# Patient Record
Sex: Female | Born: 1985
Health system: Southern US, Community
[De-identification: ages and names within clinical notes are randomized; demographics above are authoritative.]

## PROBLEM LIST (undated history)

## (undated) DIAGNOSIS — Z8742 Personal history of other diseases of the female genital tract: Secondary | ICD-10-CM

## (undated) DIAGNOSIS — E039 Hypothyroidism, unspecified: Secondary | ICD-10-CM

## (undated) HISTORY — PX: WISDOM TOOTH EXTRACTION: SHX21

## (undated) HISTORY — PX: SALPINGECTOMY: SHX328

## (undated) HISTORY — DX: Personal history of other diseases of the female genital tract: Z87.42

---

## 2006-07-24 ENCOUNTER — Other Ambulatory Visit: Admission: RE | Admit: 2006-07-24 | Discharge: 2006-07-24 | Payer: Self-pay | Admitting: Obstetrics and Gynecology

## 2006-09-07 ENCOUNTER — Emergency Department (HOSPITAL_COMMUNITY): Admission: EM | Admit: 2006-09-07 | Discharge: 2006-09-07 | Payer: Self-pay | Admitting: Emergency Medicine

## 2007-04-03 ENCOUNTER — Emergency Department (HOSPITAL_COMMUNITY): Admission: EM | Admit: 2007-04-03 | Discharge: 2007-04-03 | Payer: Self-pay | Admitting: Emergency Medicine

## 2007-05-25 ENCOUNTER — Emergency Department (HOSPITAL_COMMUNITY): Admission: EM | Admit: 2007-05-25 | Discharge: 2007-05-26 | Payer: Self-pay | Admitting: Emergency Medicine

## 2007-09-22 ENCOUNTER — Emergency Department (HOSPITAL_COMMUNITY): Admission: EM | Admit: 2007-09-22 | Discharge: 2007-09-22 | Payer: Self-pay | Admitting: Emergency Medicine

## 2007-12-19 ENCOUNTER — Emergency Department (HOSPITAL_COMMUNITY): Admission: EM | Admit: 2007-12-19 | Discharge: 2007-12-19 | Payer: Self-pay | Admitting: Emergency Medicine

## 2008-01-08 ENCOUNTER — Emergency Department (HOSPITAL_COMMUNITY): Admission: EM | Admit: 2008-01-08 | Discharge: 2008-01-09 | Payer: Self-pay | Admitting: Emergency Medicine

## 2009-02-21 ENCOUNTER — Emergency Department (HOSPITAL_COMMUNITY): Admission: EM | Admit: 2009-02-21 | Discharge: 2009-02-21 | Payer: Self-pay | Admitting: Emergency Medicine

## 2009-06-12 ENCOUNTER — Emergency Department (HOSPITAL_COMMUNITY): Admission: EM | Admit: 2009-06-12 | Discharge: 2009-06-12 | Payer: Self-pay | Admitting: Emergency Medicine

## 2009-06-16 ENCOUNTER — Emergency Department (HOSPITAL_COMMUNITY): Admission: EM | Admit: 2009-06-16 | Discharge: 2009-06-16 | Payer: Self-pay | Admitting: Emergency Medicine

## 2009-09-30 ENCOUNTER — Emergency Department (HOSPITAL_COMMUNITY): Admission: EM | Admit: 2009-09-30 | Discharge: 2009-09-30 | Payer: Self-pay | Admitting: Emergency Medicine

## 2009-10-01 ENCOUNTER — Emergency Department (HOSPITAL_COMMUNITY): Admission: EM | Admit: 2009-10-01 | Discharge: 2009-10-01 | Payer: Self-pay | Admitting: Family Medicine

## 2010-04-21 ENCOUNTER — Emergency Department (HOSPITAL_COMMUNITY)
Admission: EM | Admit: 2010-04-21 | Discharge: 2010-04-21 | Payer: Self-pay | Source: Home / Self Care | Admitting: Emergency Medicine

## 2010-05-14 ENCOUNTER — Encounter: Payer: Self-pay | Admitting: Obstetrics and Gynecology

## 2010-07-03 LAB — URINALYSIS, ROUTINE W REFLEX MICROSCOPIC
Glucose, UA: NEGATIVE mg/dL
Hgb urine dipstick: NEGATIVE
Protein, ur: NEGATIVE mg/dL
Urobilinogen, UA: 1 mg/dL (ref 0.0–1.0)

## 2010-07-03 LAB — WET PREP, GENITAL: Trich, Wet Prep: NONE SEEN

## 2010-07-03 LAB — POCT I-STAT, CHEM 8
BUN: 15 mg/dL (ref 6–23)
Chloride: 108 mEq/L (ref 96–112)
HCT: 42 % (ref 36.0–46.0)
Hemoglobin: 14.3 g/dL (ref 12.0–15.0)
Potassium: 3.7 mEq/L (ref 3.5–5.1)

## 2010-07-03 LAB — URINE MICROSCOPIC-ADD ON

## 2010-07-10 LAB — URINE MICROSCOPIC-ADD ON

## 2010-07-10 LAB — URINALYSIS, ROUTINE W REFLEX MICROSCOPIC
Glucose, UA: NEGATIVE mg/dL
Ketones, ur: NEGATIVE mg/dL
Specific Gravity, Urine: 1.023 (ref 1.005–1.030)

## 2010-07-10 LAB — GC/CHLAMYDIA PROBE AMP, GENITAL: Chlamydia, DNA Probe: NEGATIVE

## 2010-07-10 LAB — WET PREP, GENITAL
WBC, Wet Prep HPF POC: NONE SEEN
Yeast Wet Prep HPF POC: NONE SEEN

## 2010-07-12 LAB — CBC
HCT: 37.8 % (ref 36.0–46.0)
HCT: 42.6 % (ref 36.0–46.0)
Hemoglobin: 12.7 g/dL (ref 12.0–15.0)
MCHC: 34.1 g/dL (ref 30.0–36.0)
MCV: 88.4 fL (ref 78.0–100.0)
Platelets: 151 10*3/uL (ref 150–400)
RBC: 4.24 MIL/uL (ref 3.87–5.11)
RDW: 13.7 % (ref 11.5–15.5)
RDW: 13.8 % (ref 11.5–15.5)

## 2010-07-12 LAB — URINALYSIS, ROUTINE W REFLEX MICROSCOPIC
Bilirubin Urine: NEGATIVE
Glucose, UA: NEGATIVE mg/dL
Leukocytes, UA: NEGATIVE
Nitrite: NEGATIVE
Protein, ur: NEGATIVE mg/dL
Specific Gravity, Urine: 1.027 (ref 1.005–1.030)
Specific Gravity, Urine: 1.029 (ref 1.005–1.030)
Urobilinogen, UA: 0.2 mg/dL (ref 0.0–1.0)
pH: 6 (ref 5.0–8.0)
pH: 6.5 (ref 5.0–8.0)

## 2010-07-12 LAB — COMPREHENSIVE METABOLIC PANEL
ALT: 24 U/L (ref 0–35)
AST: 22 U/L (ref 0–37)
Albumin: 3.9 g/dL (ref 3.5–5.2)
Alkaline Phosphatase: 56 U/L (ref 39–117)
GFR calc Af Amer: >60 mL/min (ref >60)
GFR calc non Af Amer: >60 mL/min (ref >60)
Glucose, Bld: 99 mg/dL (ref 70–99)
Potassium: 4.4 mEq/L (ref 3.5–5.1)
Sodium: 137 mEq/L (ref 135–145)
Total Bilirubin: 0.4 mg/dL (ref 0.3–1.2)

## 2010-07-12 LAB — BASIC METABOLIC PANEL
Calcium: 8 mg/dL — ABNORMAL LOW (ref 8.4–10.5)
GFR calc Af Amer: >60 mL/min (ref >60)
GFR calc non Af Amer: >60 mL/min (ref >60)
Glucose, Bld: 92 mg/dL (ref 70–99)
Potassium: 3.8 mEq/L (ref 3.5–5.1)
Sodium: 138 mEq/L (ref 135–145)

## 2010-07-12 LAB — PROTIME-INR: INR: 1.28 (ref 0.00–1.49)

## 2010-07-12 LAB — APTT: aPTT: 35 seconds (ref 24–37)

## 2010-07-12 LAB — DIFFERENTIAL
Basophils Absolute: 0 10*3/uL (ref 0.0–0.1)
Basophils Absolute: 0 10*3/uL (ref 0.0–0.1)
Eosinophils Absolute: 0.1 10*3/uL (ref 0.0–0.7)
Eosinophils Relative: 4 % (ref 0–5)
Lymphocytes Relative: 57 % — ABNORMAL HIGH (ref 12–46)
Lymphs Abs: 2.1 10*3/uL (ref 0.7–4.0)
Monocytes Absolute: 0.2 10*3/uL (ref 0.1–1.0)
Monocytes Relative: 6 % (ref 3–12)
Neutro Abs: 1.2 10*3/uL — ABNORMAL LOW (ref 1.7–7.7)
Neutrophils Relative %: 43 % (ref 43–77)

## 2010-07-12 LAB — URINE MICROSCOPIC-ADD ON

## 2010-07-12 LAB — GC/CHLAMYDIA PROBE AMP, GENITAL
Chlamydia, DNA Probe: NEGATIVE
GC Probe Amp, Genital: NEGATIVE

## 2010-07-12 LAB — WET PREP, GENITAL: Yeast Wet Prep HPF POC: NONE SEEN

## 2010-07-26 LAB — CBC
HCT: 40.9 % (ref 36.0–46.0)
MCV: 89.6 fL (ref 78.0–100.0)
Platelets: 160 10*3/uL (ref 150–400)
RBC: 4.57 MIL/uL (ref 3.87–5.11)
WBC: 6 10*3/uL (ref 4.0–10.5)

## 2010-07-26 LAB — DIFFERENTIAL
Basophils Absolute: 0 10*3/uL (ref 0.0–0.1)
Basophils Relative: 1 % (ref 0–1)
Eosinophils Relative: 2 % (ref 0–5)
Monocytes Absolute: 0.3 10*3/uL (ref 0.1–1.0)

## 2010-07-26 LAB — LIPASE, BLOOD: Lipase: 20 U/L (ref 11–59)

## 2010-07-26 LAB — URINE MICROSCOPIC-ADD ON

## 2010-07-26 LAB — WET PREP, GENITAL
WBC, Wet Prep HPF POC: NONE SEEN
Yeast Wet Prep HPF POC: NONE SEEN

## 2010-07-26 LAB — COMPREHENSIVE METABOLIC PANEL
AST: 14 U/L (ref 0–37)
Albumin: 3.9 g/dL (ref 3.5–5.2)
Alkaline Phosphatase: 50 U/L (ref 39–117)
BUN: 8 mg/dL (ref 6–23)
CO2: 26 mEq/L (ref 19–32)
Chloride: 106 mEq/L (ref 96–112)
Creatinine, Ser: 0.72 mg/dL (ref 0.4–1.2)
GFR calc Af Amer: >60 mL/min (ref >60)
GFR calc non Af Amer: >60 mL/min (ref >60)
Potassium: 3.5 mEq/L (ref 3.5–5.1)
Total Bilirubin: 0.6 mg/dL (ref 0.3–1.2)

## 2010-07-26 LAB — URINALYSIS, ROUTINE W REFLEX MICROSCOPIC
Nitrite: NEGATIVE
Specific Gravity, Urine: 1.025 (ref 1.005–1.030)
Urobilinogen, UA: 1 mg/dL (ref 0.0–1.0)
pH: 6.5 (ref 5.0–8.0)

## 2010-07-29 ENCOUNTER — Emergency Department (HOSPITAL_COMMUNITY)
Admission: EM | Admit: 2010-07-29 | Discharge: 2010-07-29 | Disposition: A | Discharge status: Home / Self Care | Payer: Self-pay | Attending: Emergency Medicine | Admitting: Emergency Medicine

## 2010-07-29 DIAGNOSIS — R109 Unspecified abdominal pain: Secondary | ICD-10-CM | POA: Insufficient documentation

## 2010-07-29 DIAGNOSIS — A499 Bacterial infection, unspecified: Secondary | ICD-10-CM | POA: Insufficient documentation

## 2010-07-29 DIAGNOSIS — K649 Unspecified hemorrhoids: Secondary | ICD-10-CM | POA: Insufficient documentation

## 2010-07-29 DIAGNOSIS — F17200 Nicotine dependence, unspecified, uncomplicated: Secondary | ICD-10-CM | POA: Insufficient documentation

## 2010-07-29 DIAGNOSIS — N760 Acute vaginitis: Secondary | ICD-10-CM | POA: Insufficient documentation

## 2010-07-29 DIAGNOSIS — R10819 Abdominal tenderness, unspecified site: Secondary | ICD-10-CM | POA: Insufficient documentation

## 2010-07-29 DIAGNOSIS — B9689 Other specified bacterial agents as the cause of diseases classified elsewhere: Secondary | ICD-10-CM | POA: Insufficient documentation

## 2010-07-29 LAB — DIFFERENTIAL
Eosinophils Absolute: 0.2 10*3/uL (ref 0.0–0.7)
Eosinophils Relative: 4 % (ref 0–5)
Lymphocytes Relative: 37 % (ref 12–46)
Lymphs Abs: 1.8 10*3/uL (ref 0.7–4.0)
Monocytes Absolute: 0.4 10*3/uL (ref 0.1–1.0)
Monocytes Relative: 8 % (ref 3–12)

## 2010-07-29 LAB — URINALYSIS, ROUTINE W REFLEX MICROSCOPIC
Bilirubin Urine: NEGATIVE
Ketones, ur: NEGATIVE mg/dL
Nitrite: NEGATIVE
pH: 7.5 (ref 5.0–8.0)

## 2010-07-29 LAB — WET PREP, GENITAL
Trich, Wet Prep: NONE SEEN
Yeast Wet Prep HPF POC: NONE SEEN

## 2010-07-29 LAB — CBC
HCT: 37.9 % (ref 36.0–46.0)
MCH: 29.6 pg (ref 26.0–34.0)
MCHC: 33.8 g/dL (ref 30.0–36.0)
MCV: 87.5 fL (ref 78.0–100.0)
RDW: 13.3 % (ref 11.5–15.5)

## 2010-07-29 LAB — COMPREHENSIVE METABOLIC PANEL
Alkaline Phosphatase: 41 U/L (ref 39–117)
BUN: 10 mg/dL (ref 6–23)
Calcium: 8.6 mg/dL (ref 8.4–10.5)
Creatinine, Ser: 0.67 mg/dL (ref 0.4–1.2)
Glucose, Bld: 85 mg/dL (ref 70–99)
Potassium: 3.9 mEq/L (ref 3.5–5.1)
Total Protein: 6.4 g/dL (ref 6.0–8.3)

## 2010-07-29 LAB — OCCULT BLOOD, POC DEVICE: Fecal Occult Bld: NEGATIVE

## 2010-11-22 ENCOUNTER — Ambulatory Visit: Payer: Self-pay | Admitting: Family Medicine

## 2010-11-22 DIAGNOSIS — Z0289 Encounter for other administrative examinations: Secondary | ICD-10-CM

## 2011-01-12 LAB — URINALYSIS, ROUTINE W REFLEX MICROSCOPIC
Glucose, UA: NEGATIVE
Hgb urine dipstick: NEGATIVE
Ketones, ur: 15 — AB
pH: 6

## 2011-01-12 LAB — CBC
HCT: 39.7
Hemoglobin: 13.3
RBC: 4.62
WBC: 5.7

## 2011-01-12 LAB — POCT I-STAT CREATININE
Creatinine, Ser: 1
Operator id: 151321

## 2011-01-12 LAB — I-STAT 8, (EC8 V) (CONVERTED LAB)
Acid-base deficit: 1
Potassium: 3.5
TCO2: 27
pCO2, Ven: 49.3
pH, Ven: 7.327 — ABNORMAL HIGH

## 2011-01-12 LAB — DIFFERENTIAL
Eosinophils Relative: 2
Lymphocytes Relative: 35
Lymphs Abs: 2
Monocytes Absolute: 0.4
Monocytes Relative: 6

## 2011-01-12 LAB — URINE MICROSCOPIC-ADD ON

## 2011-01-12 LAB — GC/CHLAMYDIA PROBE AMP, GENITAL: GC Probe Amp, Genital: NEGATIVE

## 2011-01-12 LAB — WET PREP, GENITAL: Trich, Wet Prep: NONE SEEN

## 2011-01-18 LAB — URINE MICROSCOPIC-ADD ON

## 2011-01-18 LAB — URINALYSIS, ROUTINE W REFLEX MICROSCOPIC
Bilirubin Urine: NEGATIVE
Glucose, UA: NEGATIVE
Specific Gravity, Urine: 1.019
pH: 6.5

## 2011-01-22 LAB — URINE MICROSCOPIC-ADD ON

## 2011-01-22 LAB — URINALYSIS, ROUTINE W REFLEX MICROSCOPIC
Bilirubin Urine: NEGATIVE
Glucose, UA: NEGATIVE
Hgb urine dipstick: NEGATIVE
Ketones, ur: NEGATIVE
Nitrite: POSITIVE — AB
Protein, ur: NEGATIVE
Specific Gravity, Urine: 1.03
Urobilinogen, UA: 1
pH: 6

## 2011-01-22 LAB — RPR: RPR Ser Ql: NONREACTIVE

## 2011-01-22 LAB — GC/CHLAMYDIA PROBE AMP, GENITAL: Chlamydia, DNA Probe: NEGATIVE

## 2011-01-22 LAB — WET PREP, GENITAL
Trich, Wet Prep: NONE SEEN
Yeast Wet Prep HPF POC: NONE SEEN

## 2011-01-22 LAB — HEMOGLOBIN AND HEMATOCRIT, BLOOD: HCT: 37.9

## 2011-01-22 LAB — POCT PREGNANCY, URINE: Preg Test, Ur: NEGATIVE

## 2011-01-23 ENCOUNTER — Emergency Department (HOSPITAL_COMMUNITY)
Admission: EM | Admit: 2011-01-23 | Discharge: 2011-01-23 | Disposition: A | Discharge status: Home / Self Care | Payer: BC Managed Care – PPO | Attending: Emergency Medicine | Admitting: Emergency Medicine

## 2011-01-23 DIAGNOSIS — K645 Perianal venous thrombosis: Secondary | ICD-10-CM | POA: Insufficient documentation

## 2011-04-25 ENCOUNTER — Emergency Department (HOSPITAL_COMMUNITY)
Admission: EM | Admit: 2011-04-25 | Discharge: 2011-04-25 | Disposition: A | Discharge status: Home / Self Care | Payer: BC Managed Care – PPO | Attending: Emergency Medicine | Admitting: Emergency Medicine

## 2011-04-25 ENCOUNTER — Encounter: Payer: Self-pay | Admitting: *Deleted

## 2011-04-25 DIAGNOSIS — R51 Headache: Secondary | ICD-10-CM | POA: Insufficient documentation

## 2011-04-25 DIAGNOSIS — M273 Alveolitis of jaws: Secondary | ICD-10-CM | POA: Insufficient documentation

## 2011-04-25 DIAGNOSIS — K089 Disorder of teeth and supporting structures, unspecified: Secondary | ICD-10-CM | POA: Insufficient documentation

## 2011-04-25 MED ORDER — OXYCODONE-ACETAMINOPHEN 5-325 MG PO TABS
1.0000 | ORAL_TABLET | Freq: Once | ORAL | Status: AC
  Administered 2011-04-25: 1 via ORAL
  Filled 2011-04-25: qty 1

## 2011-04-25 MED ORDER — OXYCODONE-ACETAMINOPHEN 5-325 MG PO TABS: 1.0000 | ORAL_TABLET | ORAL | Status: AC | PRN

## 2011-04-25 MED ORDER — PENICILLIN V POTASSIUM 500 MG PO TABS: 500.0000 mg | ORAL_TABLET | Freq: Four times a day (QID) | ORAL | Status: AC

## 2011-04-25 NOTE — ED Notes (Signed)
Pt reports she had root canal on Saturday, reports throbbing and headache to right side. Pt reports she has had headaches for unknown time but it worsened after the root canal.

## 2011-04-25 NOTE — ED Provider Notes (Signed)
History     CSN: [id number]  Arrival date & time 04/25/11  1742   First MD Initiated Contact with Patient 04/25/11 1906      Chief Complaint  Patient presents with  . Facial Pain    (Consider location/radiation/quality/duration/timing/severity/associated sxs/prior treatment) HPI Comments: Patient presents with throbbing right-sided pain and headache.  She states that she had a dental extraction performed on Saturday.  She was feeling fine however her pain has started today and is gradually worsening.  Patient cannot recall the name of the dentist.  She denies fevers, night sweats, chills.  Patient states that she is a current smoker and that she did not wait to 48 hours recommended by her dentist before smoking again.  Patient states that her pain is worsened by heat and cold.  The history is provided by the patient.    History reviewed. No pertinent past medical history.  History reviewed. No pertinent past surgical history.  History reviewed. No pertinent family history.  History  Substance Use Topics  . Smoking status: Never Smoker   . Smokeless tobacco: Not on file  . Alcohol Use: No    OB History    Grav Para Term Preterm Abortions TAB SAB Ect Mult Living                  Review of Systems  Constitutional: Negative for fever, chills, diaphoresis and activity change.  HENT: Positive for dental problem (right sd facial pain ). Negative for ear pain, sore throat, facial swelling, drooling, mouth sores, trouble swallowing, neck pain, neck stiffness, voice change, sinus pressure and tinnitus.   Eyes: Negative for pain and visual disturbance.  Respiratory: Negative for shortness of breath, wheezing and stridor.   Cardiovascular: Negative for chest pain.  Gastrointestinal: Negative for nausea and abdominal pain.  Musculoskeletal: Negative for myalgias.  Skin: Negative for rash.  Neurological: Positive for headaches. Negative for speech difficulty.  Hematological:  Negative for adenopathy.  All other systems reviewed and are negative.    Allergies  Ciprofloxacin  Home Medications  No current outpatient prescriptions on file.  BP 115/71  Pulse 68  Temp(Src) 98.2 F (36.8 C) (Oral)  Resp 19  SpO2 99%  Physical Exam  Nursing note and vitals reviewed. Constitutional: She is oriented to person, place, and time. She appears well-developed and well-nourished. No distress.  HENT:  Head: Normocephalic and atraumatic. No trismus in the jaw.  Mouth/Throat: Uvula is midline, oropharynx is clear and moist and mucous membranes are normal. No oral lesions. Abnormal dentition. No dental abscesses or uvula swelling. No oropharyngeal exudate, posterior oropharyngeal edema, posterior oropharyngeal erythema or tonsillar abscesses.       No evidence of clot where extraction occurred. Likely dx "Dry Socket"   Eyes: Conjunctivae and EOM are normal.  Neck: Normal range of motion and full passive range of motion without pain. Neck supple.  Cardiovascular: Normal rate and regular rhythm.   Pulmonary/Chest: Effort normal and breath sounds normal. No stridor.  Musculoskeletal: Normal range of motion.  Lymphadenopathy:       Head (right side): No submental, no submandibular, no tonsillar, no preauricular and no posterior auricular adenopathy present.       Head (left side): No submental, no submandibular, no tonsillar, no preauricular and no posterior auricular adenopathy present.    She has no cervical adenopathy.  Neurological: She is alert and oriented to person, place, and time.  Skin: Skin is warm and dry. No rash noted. She is not  diaphoretic.    ED Course  Procedures (including critical care time)  Labs Reviewed - No data to display No results found.   No diagnosis found.     MDM  Delayed Post-Extraction Pain   Patient's pain managed in the emergency department with Percocet.  Patient refuses dental block.  Patient will be discharged with  instructions to followup with her dentist that performed her extraction to complete her full course of antibiotics and to use pain medication as directed for severe pain only.     [place], [place] 04/25/11 [phone]

## 2011-04-26 NOTE — ED Provider Notes (Signed)
Medical screening examination/treatment/procedure(s) were performed by non-physician practitioner and as supervising physician I was immediately available for consultation/collaboration.  Math Brazie, MD 04/26/11 1458 

## 2011-06-05 ENCOUNTER — Emergency Department (HOSPITAL_COMMUNITY)
Admission: EM | Admit: 2011-06-05 | Discharge: 2011-06-05 | Disposition: A | Discharge status: Home / Self Care | Payer: BC Managed Care – PPO | Attending: Emergency Medicine | Admitting: Emergency Medicine

## 2011-06-05 ENCOUNTER — Emergency Department (HOSPITAL_COMMUNITY): Payer: BC Managed Care – PPO

## 2011-06-05 ENCOUNTER — Encounter (HOSPITAL_COMMUNITY): Payer: Self-pay

## 2011-06-05 DIAGNOSIS — R10819 Abdominal tenderness, unspecified site: Secondary | ICD-10-CM | POA: Insufficient documentation

## 2011-06-05 DIAGNOSIS — R112 Nausea with vomiting, unspecified: Secondary | ICD-10-CM | POA: Insufficient documentation

## 2011-06-05 DIAGNOSIS — F17200 Nicotine dependence, unspecified, uncomplicated: Secondary | ICD-10-CM | POA: Insufficient documentation

## 2011-06-05 DIAGNOSIS — R109 Unspecified abdominal pain: Secondary | ICD-10-CM | POA: Insufficient documentation

## 2011-06-05 LAB — URINALYSIS, ROUTINE W REFLEX MICROSCOPIC
Ketones, ur: NEGATIVE mg/dL
Leukocytes, UA: NEGATIVE
Nitrite: NEGATIVE
Urobilinogen, UA: 1 mg/dL (ref 0.0–1.0)
pH: 6 (ref 5.0–8.0)

## 2011-06-05 LAB — LIPASE, BLOOD: Lipase: 43 U/L (ref 11–59)

## 2011-06-05 LAB — COMPREHENSIVE METABOLIC PANEL
Albumin: 3.6 g/dL (ref 3.5–5.2)
BUN: 13 mg/dL (ref 6–23)
Creatinine, Ser: 0.76 mg/dL (ref 0.50–1.10)
GFR calc Af Amer: >90 mL/min (ref >90)
Total Bilirubin: 0.2 mg/dL — ABNORMAL LOW (ref 0.3–1.2)
Total Protein: 6.3 g/dL (ref 6.0–8.3)

## 2011-06-05 LAB — CBC
HCT: 36.8 % (ref 36.0–46.0)
MCHC: 33.7 g/dL (ref 30.0–36.0)
MCV: 87 fL (ref 78.0–100.0)
RDW: 13.5 % (ref 11.5–15.5)

## 2011-06-05 MED ORDER — ONDANSETRON HCL 8 MG PO TABS
ORAL_TABLET | ORAL | Status: AC
  Filled 2011-06-05: qty 1

## 2011-06-05 MED ORDER — FENTANYL CITRATE 0.05 MG/ML IJ SOLN
50.0000 ug | Freq: Once | INTRAMUSCULAR | Status: DC
  Filled 2011-06-05: qty 2

## 2011-06-05 MED ORDER — ONDANSETRON HCL 4 MG/2ML IJ SOLN
4.0000 mg | Freq: Once | INTRAMUSCULAR | Status: DC
  Filled 2011-06-05: qty 2

## 2011-06-05 MED ORDER — SODIUM CHLORIDE 0.9 % IV SOLN: INTRAVENOUS | Status: DC

## 2011-06-05 MED ORDER — ONDANSETRON HCL 8 MG PO TABS
8.0000 mg | ORAL_TABLET | Freq: Once | ORAL | Status: AC
  Administered 2011-06-05: 8 mg via ORAL

## 2011-06-05 MED ORDER — IBUPROFEN 800 MG PO TABS: 800.0000 mg | ORAL_TABLET | Freq: Three times a day (TID) | ORAL | Status: DC

## 2011-06-05 NOTE — ED Notes (Signed)
PT reports episode at home of epigastric pain that was stabbing and feeling of fullness; began coughing and then went to bathroom and vomiting once; no vomiting yet; pt has been resting; no signs of distress; reports right sided spasming at this time.

## 2011-06-05 NOTE — Discharge Instructions (Signed)

## 2011-06-05 NOTE — ED Notes (Signed)
Patient is resting comfortably. 

## 2011-06-05 NOTE — ED Notes (Signed)
Pt c/o n/v with epigastric pain.

## 2011-06-05 NOTE — ED Provider Notes (Signed)
History     CSN: [id number]  Arrival date & time 06/05/11  0137   First MD Initiated Contact with Patient 06/05/11 0228      Chief Complaint  Patient presents with  . Abdominal Pain    epigastric    (Consider location/radiation/quality/duration/timing/severity/associated sxs/prior treatment) Patient is a 26 y.o. female presenting with abdominal pain. The history is provided by the patient.  Abdominal Pain The primary symptoms of the illness include abdominal pain, nausea and vomiting. The primary symptoms of the illness do not include fever, shortness of breath, diarrhea or dysuria. The current episode started yesterday. The onset of the illness was gradual. The problem has not changed since onset. Associated with: Nothing. Is not affected by eating. No recent antibiotics. The patient states that she believes she is currently not pregnant. The patient has not had a change in bowel habit. Risk factors: Is taking Clomid and had a recent pelvic ultrasound I her OB/GYN. Symptoms associated with the illness do not include chills, anorexia, diaphoresis, heartburn, constipation, urgency, hematuria, frequency or back pain. Significant associated medical issues do not include inflammatory bowel disease or gallstones.   epigastric and right upper abdominal pain. It comes and goes since yesterday but has been constant tonight. It is associated with some nausea and vomiting. No diarrhea. She has had this pain before but it goes away. Sharp in quality. Moderate in severity. No radiation. No vaginal bleeding or discharge. No pelvic pain. No fevers or chills. No aggravating or alleviating factors.  History reviewed. No pertinent past medical history.  History reviewed. No pertinent past surgical history.  No family history on file.  History  Substance Use Topics  . Smoking status: Current Everyday Smoker    Types: Cigars  . Smokeless tobacco: Not on file  . Alcohol Use: No    OB History    Grav  Para Term Preterm Abortions TAB SAB Ect Mult Living                  Review of Systems  Constitutional: Negative for fever, chills and diaphoresis.  HENT: Negative for neck pain and neck stiffness.   Eyes: Negative for pain.  Respiratory: Negative for shortness of breath.   Cardiovascular: Negative for chest pain.  Gastrointestinal: Positive for nausea, vomiting and abdominal pain. Negative for heartburn, diarrhea, constipation and anorexia.  Genitourinary: Negative for dysuria, urgency, frequency and hematuria.  Musculoskeletal: Negative for back pain.  Skin: Negative for rash.  Neurological: Negative for headaches.  All other systems reviewed and are negative.    Allergies  Ciprofloxacin  Home Medications   Current Outpatient Rx  Name Route Sig Dispense Refill  . CLOMIPHENE CITRATE 50 MG PO TABS Oral Take 50 mg by mouth daily. Takes for 5 days starting on day 3 of her menstrual cycle.      BP 115/63  Pulse 73  Temp(Src) 98.1 F (36.7 C) (Oral)  Resp 16  SpO2 98%  LMP 05/22/2011  Physical Exam  Constitutional: She is oriented to person, place, and time. She appears well-developed and well-nourished.  HENT:  Head: Normocephalic and atraumatic.  Eyes: Conjunctivae and EOM are normal. Pupils are equal, round, and reactive to light.  Neck: Trachea normal. Neck supple. No thyromegaly present.  Cardiovascular: Normal rate, regular rhythm, S1 normal, S2 normal and normal pulses.     No systolic murmur is present   No diastolic murmur is present  Pulses:      Radial pulses are 2+ on the  right side, and 2+ on the left side.  Pulmonary/Chest: Effort normal and breath sounds normal. She has no wheezes. She has no rhonchi. She has no rales. She exhibits no tenderness.  Abdominal: Soft. Normal appearance and bowel sounds are normal. There is tenderness. There is no rebound, no guarding, no CVA tenderness and negative Murphy's sign.       Tender right upper quadrant and  somewhat over epigastrium.  Musculoskeletal:       BLE:s Calves nontender, no cords or erythema, negative Homans sign  Neurological: She is alert and oriented to person, place, and time. She has normal strength. No cranial nerve deficit or sensory deficit. GCS eye subscore is 4. GCS verbal subscore is 5. GCS motor subscore is 6.  Skin: Skin is warm and dry. No rash noted. She is not diaphoretic.  Psychiatric: Her speech is normal.       Cooperative and appropriate    ED Course  Procedures (including critical care time)  Results for orders placed during the hospital encounter of 06/05/11  CBC      Component Value Range   WBC 4.0  4.0 - 10.5 (K/uL)   RBC 4.23  3.87 - 5.11 (MIL/uL)   Hemoglobin 12.4  12.0 - 15.0 (g/dL)   HCT 11.9  14.7 - 82.9 (%)   MCV 87.0  78.0 - 100.0 (fL)   MCH 29.3  26.0 - 34.0 (pg)   MCHC 33.7  30.0 - 36.0 (g/dL)   RDW 56.2  13.0 - 86.5 (%)   Platelets 146 (*) 150 - 400 (K/uL)  COMPREHENSIVE METABOLIC PANEL      Component Value Range   Sodium 140  135 - 145 (mEq/L)   Potassium 4.3  3.5 - 5.1 (mEq/L)   Chloride 108  96 - 112 (mEq/L)   CO2 25  19 - 32 (mEq/L)   Glucose, Bld 96  70 - 99 (mg/dL)   BUN 13  6 - 23 (mg/dL)   Creatinine, Ser 7.84  0.50 - 1.10 (mg/dL)   Calcium 8.9  8.4 - 69.6 (mg/dL)   Total Protein 6.3  6.0 - 8.3 (g/dL)   Albumin 3.6  3.5 - 5.2 (g/dL)   AST 12  0 - 37 (U/L)   ALT 8  0 - 35 (U/L)   Alkaline Phosphatase 41  39 - 117 (U/L)   Total Bilirubin 0.2 (*) 0.3 - 1.2 (mg/dL)   GFR calc non Af Amer >90  >90 (mL/min)   GFR calc Af Amer >90  >90 (mL/min)  LIPASE, BLOOD      Component Value Range   Lipase 43  11 - 59 (U/L)  URINALYSIS, ROUTINE W REFLEX MICROSCOPIC      Component Value Range   Color, Urine YELLOW  YELLOW    APPearance CLEAR  CLEAR    Specific Gravity, Urine 1.031 (*) 1.005 - 1.030    pH 6.0  5.0 - 8.0    Glucose, UA NEGATIVE  NEGATIVE (mg/dL)   Hgb urine dipstick NEGATIVE  NEGATIVE    Bilirubin Urine SMALL (*)  NEGATIVE    Ketones, ur NEGATIVE  NEGATIVE (mg/dL)   Protein, ur NEGATIVE  NEGATIVE (mg/dL)   Urobilinogen, UA 1.0  0.0 - 1.0 (mg/dL)   Nitrite NEGATIVE  NEGATIVE    Leukocytes, UA NEGATIVE  NEGATIVE   PREGNANCY, URINE      Component Value Range   Preg Test, Ur NEGATIVE  NEGATIVE    US Abdomen Complete  06/05/2011  *RADIOLOGY  REPORT*  Clinical Data:  Right upper quadrant pain.  COMPLETE ABDOMINAL ULTRASOUND  Comparison:  CT 02/21/2009  Findings:  Gallbladder:  No gallstones, gallbladder wall thickening, or pericholecystic fluid.  Common bile duct:  No bile duct dilatation.  Common bile duct measures 3 mm diameter.  Liver:  No focal lesion identified.  Within normal limits in parenchymal echogenicity.  IVC:  Appears normal.  Pancreas:  Not visualized due to overlying bowel gas.  Spleen:  Spleen length measures 8.5 cm.  Normal homogeneous parenchymal echotexture.  Right Kidney:  Right kidney measures 10.7 cm length.  No hydronephrosis.  Left Kidney:  Left kidney measures 10.8 cm length.  No hydronephrosis.  Abdominal aorta:  No aneurysm identified.  IMPRESSION: Negative abdominal ultrasound.  Original Report Authenticated By: Marlon Pel, M.D.      MDM   Right upper quadrant and epigastric pain evaluated with ultrasound and labs. Pain control. Ultrasound obtained and reviewed no gallstones or evidence of cholecystitis. On recheck pain has resolved. No nausea vomiting in the emergency department. Work note provided as requested. Reliable historian verbalizes understanding need for immediate GYN followup if she develops lower abdominal pain on Clomid. She agrees to followup with her physician in the clinic and be evaluated again for any return of symptoms, especially if she develops lower abdominal pain.         Sunnie Nielsen, MD 06/05/11 [phone]

## 2011-06-05 NOTE — ED Notes (Signed)
Pt stats she is not able to void at this time

## 2011-06-05 NOTE — ED Notes (Signed)
PT returned from [place]; no signs of distress; family at bedside.

## 2011-06-05 NOTE — ED Notes (Signed)
PT has no signs if distress; VSS; A&Ox3; no questions at this time; skin warm and dry; respirations even and unlabored; A&Ox3.

## 2011-06-05 NOTE — ED Notes (Signed)
PT reports she does not want and IV. MD made aware.

## 2011-06-05 NOTE — ED Notes (Signed)
Patient transported to Ultrasound 

## 2011-06-09 ENCOUNTER — Emergency Department (HOSPITAL_COMMUNITY)
Admission: EM | Admit: 2011-06-09 | Discharge: 2011-06-09 | Disposition: A | Discharge status: Home / Self Care | Payer: BC Managed Care – PPO | Attending: Emergency Medicine | Admitting: Emergency Medicine

## 2011-06-09 ENCOUNTER — Encounter (HOSPITAL_COMMUNITY): Payer: Self-pay | Admitting: Emergency Medicine

## 2011-06-09 DIAGNOSIS — R109 Unspecified abdominal pain: Secondary | ICD-10-CM | POA: Insufficient documentation

## 2011-06-09 MED ORDER — IBUPROFEN 600 MG PO TABS: 600.0000 mg | ORAL_TABLET | Freq: Three times a day (TID) | ORAL | Status: AC | PRN

## 2011-06-09 MED ORDER — HYDROCODONE-ACETAMINOPHEN 5-500 MG PO TABS: 1.0000 | ORAL_TABLET | Freq: Four times a day (QID) | ORAL | Status: AC | PRN

## 2011-06-09 MED ORDER — IBUPROFEN 200 MG PO TABS
600.0000 mg | ORAL_TABLET | Freq: Once | ORAL | Status: AC
  Administered 2011-06-09: 600 mg via ORAL
  Filled 2011-06-09: qty 3

## 2011-06-09 MED ORDER — HYDROCODONE-ACETAMINOPHEN 5-325 MG PO TABS
1.0000 | ORAL_TABLET | Freq: Once | ORAL | Status: AC
  Administered 2011-06-09: 1 via ORAL
  Filled 2011-06-09: qty 1

## 2011-06-09 NOTE — ED Notes (Signed)
Pt reports right sided abdominal pain. Pt reports seen here previously this week for same.

## 2011-06-09 NOTE — Discharge Instructions (Signed)

## 2011-06-09 NOTE — ED Provider Notes (Signed)
History     CSN: [id number]  Arrival date & time 06/09/11  1149   First MD Initiated Contact with Patient 06/09/11 1225      Chief Complaint  Patient presents with  . Abdominal Pain    Right side     Patient is a 26 y.o. female presenting with abdominal pain. The history is provided by the patient and the spouse.  Abdominal Pain The primary symptoms of the illness include abdominal pain.   the patient reports long-standing history of intermittent abdominal pain.  She's been seen by multiple physicians before in the past without a firm diagnosis made.  She does report worsening abdominal pain over the past several weeks.  She was seen in this emergency department recently with normal labs and an ultrasound demonstrating no cholelithiasis and no other abnormalities.  Nothing worsens her symptoms.  Nothing improves her symptoms.  Her pain is mild to moderate when it comes on.  She does report she is currently on Clomid as her in her husband are trying to conceive.  She denies dysuria or urinary frequency.  She denies flank pain.  She denies vaginal discharge.  She denies vaginal bleeding.  She denies fevers and chills.  She reports a one-time somewhat but her pain may be secondary to endometriosis.  She has no prior history of ovarian cysts.  Her pain is intermittent and when it comes it lasts for minutes.  Currently her pain is very mild History reviewed. No pertinent past medical history.  History reviewed. No pertinent past surgical history.  History reviewed. No pertinent family history.  History  Substance Use Topics  . Smoking status: Current Everyday Smoker    Types: Cigars  . Smokeless tobacco: Not on file  . Alcohol Use: No    OB History    Grav Para Term Preterm Abortions TAB SAB Ect Mult Living                  Review of Systems  Gastrointestinal: Positive for abdominal pain.  All other systems reviewed and are negative.    Allergies  Ciprofloxacin  Home  Medications   Current Outpatient Rx  Name Route Sig Dispense Refill  . CLOMIPHENE CITRATE 50 MG PO TABS Oral Take 50 mg by mouth daily. Takes for 5 days starting on day 3 of her menstrual cycle.    Marland Kitchen HYDROCODONE-ACETAMINOPHEN 5-500 MG PO TABS Oral Take 1-2 tablets by mouth every 6 (six) hours as needed for pain. 15 tablet 0  . IBUPROFEN 600 MG PO TABS Oral Take 1 tablet (600 mg total) by mouth every 8 (eight) hours as needed for pain. 15 tablet 0    BP 116/65  Pulse 81  Temp(Src) 97.9 F (36.6 C) (Oral)  Resp 18  SpO2 100%  LMP 05/22/2011  Physical Exam  Nursing note and vitals reviewed. Constitutional: She is oriented to person, place, and time. She appears well-developed and well-nourished. No distress.  HENT:  Head: Normocephalic and atraumatic.  Eyes: EOM are normal.  Neck: Normal range of motion.  Cardiovascular: Normal rate, regular rhythm and normal heart sounds.   Pulmonary/Chest: Effort normal and breath sounds normal.  Abdominal: Soft. She exhibits no distension. There is no tenderness.  Musculoskeletal: Normal range of motion.  Neurological: She is alert and oriented to person, place, and time.  Skin: Skin is warm and dry.  Psychiatric: She has a normal mood and affect. Judgment normal.    ED Course  Procedures (including critical care  time)   Labs Reviewed  PREGNANCY, URINE   No results found.   1. Abdominal pain       MDM  Her urine pregnancy is negative.  Her abdominal exam is benign.  I reviewed her labs including a CBC CMP and lipase as well as an ultrasound from her last ER visit all of which was normal.  I don't believe she needs additional imaging.  I don't think laboratory data will assist in this.  It may be that she is feeling her ovulation which is likely prominent secondary to the Clomid.  I last her for back to her OB/GYN and primary care Dr.  She understands to return to the ER for new or worsening symptoms.  Her vital signs are normal.  Her  pain is improved in the emergency department.  She'll be sent home on a short course of anti-inflammatory and narcotic medications        Lyanne Co, MD 06/09/11 1346

## 2011-06-09 NOTE — ED Notes (Signed)
Pt adamantly unable to provide urine at this time.

## 2011-07-04 ENCOUNTER — Encounter (INDEPENDENT_AMBULATORY_CARE_PROVIDER_SITE_OTHER): Payer: BC Managed Care – PPO | Admitting: Obstetrics and Gynecology

## 2011-07-04 DIAGNOSIS — N979 Female infertility, unspecified: Secondary | ICD-10-CM

## 2011-07-11 ENCOUNTER — Encounter: Payer: Self-pay | Admitting: Obstetrics and Gynecology

## 2011-07-13 ENCOUNTER — Other Ambulatory Visit: Payer: Self-pay | Admitting: Obstetrics and Gynecology

## 2011-07-13 DIAGNOSIS — N979 Female infertility, unspecified: Secondary | ICD-10-CM

## 2011-07-20 ENCOUNTER — Other Ambulatory Visit: Payer: Self-pay | Admitting: Obstetrics and Gynecology

## 2011-07-20 ENCOUNTER — Ambulatory Visit (HOSPITAL_COMMUNITY)
Admission: RE | Admit: 2011-07-20 | Discharge: 2011-07-20 | Disposition: A | Discharge status: Home / Self Care | Payer: BC Managed Care – PPO | Source: Ambulatory Visit | Attending: Obstetrics and Gynecology | Admitting: Obstetrics and Gynecology

## 2011-07-20 DIAGNOSIS — N838 Other noninflammatory disorders of ovary, fallopian tube and broad ligament: Secondary | ICD-10-CM | POA: Insufficient documentation

## 2011-07-20 DIAGNOSIS — N979 Female infertility, unspecified: Secondary | ICD-10-CM

## 2011-07-20 DIAGNOSIS — E282 Polycystic ovarian syndrome: Secondary | ICD-10-CM

## 2011-07-21 ENCOUNTER — Ambulatory Visit (HOSPITAL_COMMUNITY)
Admission: RE | Admit: 2011-07-21 | Discharge: 2011-07-21 | Disposition: A | Discharge status: Home / Self Care | Payer: BC Managed Care – PPO | Source: Ambulatory Visit | Attending: Obstetrics and Gynecology | Admitting: Obstetrics and Gynecology

## 2011-07-21 DIAGNOSIS — E282 Polycystic ovarian syndrome: Secondary | ICD-10-CM | POA: Insufficient documentation

## 2011-07-21 NOTE — Consult Note (Addendum)
07/21/2011  Jay Schlichter  Medical records number: [id number]  Date of birth: [birth year]  History of present illness:  The patient is a 26 year old female, gravida 0, who has [facility]. Patient has a history of infertility. Her last menstrual period was on 07/10/2011. She has a history of polycystic ovary disease. She took Clomid 50 mg each day between day 3 and day 7. She presents for follicle evaluation. She wants to proceed with intrauterine insemination. A follicle study performed yesterday showed multiple ovarian cysts with the largest cyst measuring 16 x 15 mm.  Ultrasound results:  The endometrium has 3 layers and measures 8 mm. The right ovary has multiple small cysts. There are 3 larger cyst measuring 18 x 15 mm, 18 x 20 mm, an 11 x 9 mm. The left ovary has 3 cysts that measure 18 x 16 mm, 19 x 15 mm, an 11 x 7 mm..  Discussion:  The follicle study was reviewed with the patient and her significant other. We discussed our management options. The patient was told that she can repeat her follicle study on 07/22/2011 and  was told that one follicle may become the dominant follicle. We discussed the fact that it is not recommended that one proceed with hCG when you have 3 or more large follicles because of the risk of hyperstimulation and because of the risk of multiple gestations. The risk of twins, triplets, and higher order multiples were reviewed with the patient and her significant other. At this point they are interested in repeating the ultrasound tomorrow but I discouraged this. The cost of ultrasounds was reviewed.  Mylinda Latina.D.

## 2011-07-21 NOTE — Consult Note (Signed)
07/20/2011  Jay Schlichter  Medical records number: [id number]  Date of birth: 1985-07-16  History of present illness:  The patient is a 26 year old female, gravida 0, who has been [facility]. Patient has a history of infertility. Her last menstrual period was on 07/10/2011. She has a history of polycystic ovary disease. She took Clomid 50 mg each day between day 3 and day 7. She presents for follicle evaluation. She was to proceed with intrauterine insemination.  Ultrasound results:  The endometrium has 3 layers and measures 7.3 mm. The right ovary has multiple small cysts. There are 3 larger cyst measuring 16 x 15 mm, 15 x 13 mm, and 10 x 9 mm. The left ovary has 2 cysts that measure 15 x 10 mm, and 16 x 13 mm.  Discussion:  The follicle study was reviewed with the patient and her significant other. We discussed our management options. The patient was told that she can wait until 07/22/2011 to repeat her ultrasound. She and her significant other want to have daily ultrasounds performed. The patient was scheduled to return in small morning at 8:00 AM.  Leonard Schwartz M.D.  The patient was seen on 07/20/2011 but this note was dictated on 07/21/2011 because of computer difficulties.

## 2011-07-22 ENCOUNTER — Other Ambulatory Visit: Payer: Self-pay | Admitting: Obstetrics and Gynecology

## 2011-07-22 ENCOUNTER — Ambulatory Visit (HOSPITAL_COMMUNITY)
Admission: RE | Admit: 2011-07-22 | Discharge: 2011-07-22 | Disposition: A | Discharge status: Home / Self Care | Payer: BC Managed Care – PPO | Source: Ambulatory Visit | Attending: Obstetrics and Gynecology | Admitting: Obstetrics and Gynecology

## 2011-07-22 ENCOUNTER — Encounter: Payer: Self-pay | Admitting: Obstetrics and Gynecology

## 2011-07-22 DIAGNOSIS — N979 Female infertility, unspecified: Secondary | ICD-10-CM

## 2011-07-22 DIAGNOSIS — N838 Other noninflammatory disorders of ovary, fallopian tube and broad ligament: Secondary | ICD-10-CM | POA: Insufficient documentation

## 2011-07-22 NOTE — Consult Note (Signed)
07/22/11  MEDINA, Janny DOB: 1985/11/26  Follicle Study today: Right Ovarian Cyst: 20x18x12; 19x16x16                                  Left Ovarian Cyst: 21x18x17; 20x17x12  I spoke with the patient on the telephone. The ultrasound findings were reviewed. I did not recommend HCG injection. The risks and benefits were again outlined.  The patient will make an appointment with her primary gynecologist to determine her treatment plan.  Mylinda Latina.D.

## 2011-07-22 NOTE — Consult Note (Signed)
Salazar, Brittney  Date of birth: July 11, 1985  Today's date: 07/22/2011  The patient was called yesterday and again today. We discussed the concern for hyperstimulation of the ovaries and for multiple gestations. The patient was told that it is not likely that we will be able to proceed with HCG injection today. Nevertheless, the patient and Brittney Salazar want to proceed with ultrasound today in spite of the cost. They are hoping that one follicle will be dominant and the others will resolve somewhat. All questions were answered. We will proceed as they request.  Mylinda Latina.D.

## 2011-08-07 ENCOUNTER — Encounter: Payer: Self-pay | Admitting: Obstetrics and Gynecology

## 2011-08-07 DIAGNOSIS — N970 Female infertility associated with anovulation: Secondary | ICD-10-CM

## 2011-08-07 DIAGNOSIS — Z8619 Personal history of other infectious and parasitic diseases: Secondary | ICD-10-CM | POA: Insufficient documentation

## 2011-08-07 DIAGNOSIS — R7989 Other specified abnormal findings of blood chemistry: Secondary | ICD-10-CM | POA: Insufficient documentation

## 2011-08-07 DIAGNOSIS — L680 Hirsutism: Secondary | ICD-10-CM | POA: Insufficient documentation

## 2011-08-07 DIAGNOSIS — E282 Polycystic ovarian syndrome: Secondary | ICD-10-CM | POA: Insufficient documentation

## 2011-08-07 DIAGNOSIS — E559 Vitamin D deficiency, unspecified: Secondary | ICD-10-CM | POA: Insufficient documentation

## 2011-08-08 ENCOUNTER — Ambulatory Visit (INDEPENDENT_AMBULATORY_CARE_PROVIDER_SITE_OTHER): Payer: BC Managed Care – PPO | Admitting: Obstetrics and Gynecology

## 2011-08-08 ENCOUNTER — Encounter: Payer: Self-pay | Admitting: Obstetrics and Gynecology

## 2011-08-08 VITALS — BP 110/70 | Temp 98.6°F | Resp 18 | Wt 190.0 lb

## 2011-08-08 DIAGNOSIS — N926 Irregular menstruation, unspecified: Secondary | ICD-10-CM

## 2011-08-08 DIAGNOSIS — N970 Female infertility associated with anovulation: Secondary | ICD-10-CM

## 2011-08-08 LAB — POCT URINALYSIS DIPSTICK
Ketones, UA: NEGATIVE
Leukocytes, UA: NEGATIVE
Spec Grav, UA: 1.01
pH, UA: 6

## 2011-08-08 MED ORDER — CLOMIPHENE CITRATE 50 MG PO TABS: 50.0000 mg | ORAL_TABLET | Freq: Every day | ORAL | Status: DC

## 2011-08-08 NOTE — Progress Notes (Signed)
  Current contraception: none. Hormone replacement therapy: No New medication: No  History of ZOX:WRUE  History of infertility: yes  History of abnormal Pap smear: no History of fibroids: No  Increased stress: Yes  Abnormal bleeding pattern started: bleeding started today   Filed Vitals:   08/08/11 1519  BP: 110/70  Temp: 98.6 F (37 C)  Resp: 18     Took clomid mid March and on cycle U/S 07/21/11 Follicle study at hospital.  HCG not given secondary greater than 2 dominant follicles. Clomid this cycle  Results for orders placed in visit on 08/08/11  POCT URINALYSIS DIPSTICK      Component Value Range   Color, UA dark red     Clarity, UA       Glucose, UA neg     Bilirubin, UA neg     Ketones, UA neg     Spec Grav, UA 1.010     Blood, UA yes     pH, UA 6.0     Protein, UA tr     Urobilinogen, UA negative     Nitrite, UA neg     Leukocytes, UA Negative    POCT URINE PREGNANCY      Component Value Range   Preg Test, Ur Negative     Follicle Study Day 11 of cycle at [facility] (falls on a Sat 4/27) If follicle > or = to 20mm - then hcg rto 4wks

## 2011-08-08 NOTE — Progress Notes (Signed)
Addended by: Osborn Coho on: 08/08/2011 04:52 PM   Modules accepted: Orders

## 2011-08-09 NOTE — Telephone Encounter (Signed)
This encounter was created in error - please disregard.

## 2011-08-18 ENCOUNTER — Other Ambulatory Visit: Payer: Self-pay | Admitting: Obstetrics and Gynecology

## 2011-08-18 ENCOUNTER — Ambulatory Visit (HOSPITAL_COMMUNITY)
Admission: RE | Admit: 2011-08-18 | Discharge: 2011-08-18 | Disposition: A | Discharge status: Home / Self Care | Payer: BC Managed Care – PPO | Source: Ambulatory Visit | Attending: Obstetrics and Gynecology | Admitting: Obstetrics and Gynecology

## 2011-08-18 DIAGNOSIS — N979 Female infertility, unspecified: Secondary | ICD-10-CM | POA: Insufficient documentation

## 2011-08-18 DIAGNOSIS — N838 Other noninflammatory disorders of ovary, fallopian tube and broad ligament: Secondary | ICD-10-CM | POA: Insufficient documentation

## 2011-08-18 DIAGNOSIS — N970 Female infertility associated with anovulation: Secondary | ICD-10-CM

## 2011-08-19 ENCOUNTER — Other Ambulatory Visit: Payer: Self-pay | Admitting: Obstetrics and Gynecology

## 2011-08-19 ENCOUNTER — Inpatient Hospital Stay (HOSPITAL_COMMUNITY)
Admission: AD | Admit: 2011-08-19 | Discharge: 2011-08-19 | Disposition: A | Discharge status: Home / Self Care | Payer: BC Managed Care – PPO | Source: Ambulatory Visit | Attending: Obstetrics and Gynecology | Admitting: Obstetrics and Gynecology

## 2011-08-19 ENCOUNTER — Ambulatory Visit (HOSPITAL_COMMUNITY)
Admission: RE | Admit: 2011-08-19 | Discharge: 2011-08-19 | Disposition: A | Discharge status: Home / Self Care | Payer: BC Managed Care – PPO | Source: Ambulatory Visit | Attending: Obstetrics and Gynecology | Admitting: Obstetrics and Gynecology

## 2011-08-19 DIAGNOSIS — N838 Other noninflammatory disorders of ovary, fallopian tube and broad ligament: Secondary | ICD-10-CM | POA: Insufficient documentation

## 2011-08-19 DIAGNOSIS — N979 Female infertility, unspecified: Secondary | ICD-10-CM

## 2011-08-19 MED ORDER — CHORIONIC GONADOTROPIN 10000 UNITS IM SOLR: 25.0000 [IU] | Freq: Once | INTRAMUSCULAR | Status: DC

## 2011-08-19 NOTE — ED Notes (Signed)
Pt here for midwife to eval and give pt injection. Pt brought medication with her. See provider note.

## 2011-08-19 NOTE — Progress Notes (Signed)
Pt is here for Ovidrel 250 injection.  Given SQ in R arm without difficulty Pt to f/u with Dr Stefano Gaul as scheduled.  D/C home in stable condition

## 2011-08-19 NOTE — Consult Note (Signed)
The patient has decided not to get her shot today for financial reasons. She will try to make other arrangements and will contact us if our services are needed.  Dr. Stefano Gaul

## 2011-08-19 NOTE — ED Notes (Signed)
Pt here for injection. Injection not availabel  At [facility].. Pt went to get it in pharmacy ([facility]). Was unable to afford it. Dr. Stefano Gaul  Aware and pt went home.

## 2011-08-19 NOTE — Consult Note (Addendum)
The patient presents with a history of infertility. An ultrasound was performed on 08/18/2011 to measure the size of her ovarian follicle. The follicle was not yet 20 mm in size. She presents today for repeat ultrasound. She does have a follicle that measures at least 20 mm today. She has a total of 4 follicles. We discussed the possibility that if we proceed with hCG injection then she would have an increased risk of ovarian hyperstimulation and multiple conceptions. The patient reports that she is willing to accept that risk and that she was to proceed with hCG injection.  HCG is currently on back order at the [facility] and therefore Ovidrel 250 mcg was called to [facility] on [address] in [place], [place]. The patient will pick up her medication and then return to the [facility] [place] for injection.  Dr. Stefano Gaul

## 2011-08-20 ENCOUNTER — Encounter: Payer: BC Managed Care – PPO | Admitting: Obstetrics and Gynecology

## 2011-08-20 ENCOUNTER — Telehealth: Payer: Self-pay | Admitting: Obstetrics and Gynecology

## 2011-08-20 NOTE — Telephone Encounter (Signed)
Routed to Brittney Salazar/abstracted

## 2011-08-22 ENCOUNTER — Telehealth: Payer: Self-pay

## 2011-08-22 NOTE — Telephone Encounter (Signed)
Pt did not show up for IUI  After DF sched it and told pt she would need to pay 290.00 at check-in. Melody Comas A

## 2014-04-20 ENCOUNTER — Emergency Department (HOSPITAL_COMMUNITY)
Admission: EM | Admit: 2014-04-20 | Discharge: 2014-04-20 | Disposition: A | Discharge status: Home / Self Care | Payer: BC Managed Care – PPO | Attending: Emergency Medicine | Admitting: Emergency Medicine

## 2014-04-20 ENCOUNTER — Encounter (HOSPITAL_COMMUNITY): Payer: Self-pay | Admitting: *Deleted

## 2014-04-20 DIAGNOSIS — X58XXXA Exposure to other specified factors, initial encounter: Secondary | ICD-10-CM | POA: Insufficient documentation

## 2014-04-20 DIAGNOSIS — Y9389 Activity, other specified: Secondary | ICD-10-CM | POA: Insufficient documentation

## 2014-04-20 DIAGNOSIS — Y9289 Other specified places as the place of occurrence of the external cause: Secondary | ICD-10-CM | POA: Insufficient documentation

## 2014-04-20 DIAGNOSIS — Y998 Other external cause status: Secondary | ICD-10-CM | POA: Insufficient documentation

## 2014-04-20 DIAGNOSIS — Z720 Tobacco use: Secondary | ICD-10-CM | POA: Insufficient documentation

## 2014-04-20 DIAGNOSIS — S61313A Laceration without foreign body of left middle finger with damage to nail, initial encounter: Secondary | ICD-10-CM | POA: Insufficient documentation

## 2014-04-20 DIAGNOSIS — Z23 Encounter for immunization: Secondary | ICD-10-CM | POA: Insufficient documentation

## 2014-04-20 DIAGNOSIS — S61309A Unspecified open wound of unspecified finger with damage to nail, initial encounter: Secondary | ICD-10-CM

## 2014-04-20 DIAGNOSIS — Z79899 Other long term (current) drug therapy: Secondary | ICD-10-CM | POA: Insufficient documentation

## 2014-04-20 MED ORDER — LIDOCAINE HCL (PF) 1 % IJ SOLN
5.0000 mL | Freq: Once | INTRAMUSCULAR | Status: AC
Start: 2014-04-20 — End: 2014-04-20
  Administered 2014-04-20: 5 mL
  Filled 2014-04-20: qty 5

## 2014-04-20 MED ORDER — CEPHALEXIN 500 MG PO CAPS: 500.0000 mg | ORAL_CAPSULE | Freq: Four times a day (QID) | ORAL | Status: DC

## 2014-04-20 MED ORDER — HYDROCODONE-ACETAMINOPHEN 5-325 MG PO TABS
1.0000 | ORAL_TABLET | ORAL | Status: DC | PRN
Start: 2014-04-20 — End: 2015-10-29

## 2014-04-20 MED ORDER — IBUPROFEN 800 MG PO TABS: 800.0000 mg | ORAL_TABLET | Freq: Three times a day (TID) | ORAL | Status: DC | PRN

## 2014-04-20 MED ORDER — TETANUS-DIPHTH-ACELL PERTUSSIS 5-2.5-18.5 LF-MCG/0.5 IM SUSP
0.5000 mL | Freq: Once | INTRAMUSCULAR | Status: AC
  Administered 2014-04-20: 0.5 mL via INTRAMUSCULAR
  Filled 2014-04-20: qty 0.5

## 2014-04-20 NOTE — Progress Notes (Signed)
Orthopedic Tech Progress Note Patient Details:  Brittney Salazar [birth year] [id number] Applied static foam/aluminum splint to Lt. third finger.  Movement, sensation, intact before and after application.  Capillary refill less than 2 seconds before and after application. Ortho Devices Type of Ortho Device: Finger splint Ortho Device/Splint Location: Lt. third finger. Ortho Device/Splint Interventions: Application   [name], [name] L 04/20/2014, 7:54 PM

## 2014-04-20 NOTE — ED Notes (Signed)
Pt in stating that her artifical nail and her real nail detached from her nail bed on there left middle finger this morning

## 2014-04-20 NOTE — Discharge Instructions (Signed)
°  Read the information below.  Use the prescribed medication as directed.  Please discuss all new medications with your pharmacist.  Do not take additional tylenol while taking the prescribed pain medication to avoid overdose.  You may return to the Emergency Department at any time for worsening condition or any new symptoms that concern you.  If there is any possibility that you might be pregnant, please let your health care provider know and discuss this with the pharmacist to ensure medication safety.    If you develop redness, swelling, pus draining from the wound, or fevers greater than 100.4, return to the ER immediately for a recheck.     Fingernail or Toenail Loss All or part of your fingernail or toenail has been lost. This may or may not grow back as a normal nail. A special non-stick bandage has been put on your finger or toe tightly to prevent bleeding. HOME CARE INSTRUCTIONS  The tips of fingers and toes are full of nerves and injuries are often very painful. The following will help you decrease the pain and obtain the best outcome.  Keep your hand or foot elevated above your heart to relieve pain and swelling. This will require lying in bed or on a couch with the hand or leg on pillows or sitting in a recliner with the leg up. Letting your hand or leg dangle may increase swelling, slow healing and cause throbbing pain.  Keep your dressing dry and clean.  Change your bandage in 24 hours after going home.  After your bandage is changed, soak your hand or foot in warm soapy water for 10 to 20 minutes. Do this 3 times per day. This helps reduce pain and swelling. After soaking, apply a clean, dry bandage. Change your bandage if it is wet or dirty.  Only take over-the-counter or prescription medicines for pain, discomfort, or fever as directed by your caregiver.  See your caregiver as needed for problems. SEEK IMMEDIATE MEDICAL CARE IF:   You have increased pain, swelling, drainage, or  bleeding.  You have a fever. MAKE SURE YOU:   Understand these instructions.  Will watch your condition.  Will get help right away if you are not doing well or get worse. Document Released: 03/01/2006 Document Revised: 07/02/2011 Document Reviewed: 05/21/2006 [facility] Patient Information 2015 [employer] [place]. This information is not intended to replace advice given to you by your health care provider. Make sure you discuss any questions you have with your health care provider.

## 2014-04-20 NOTE — ED Provider Notes (Signed)
CSN: [id number]     Arrival date & time 04/20/14  1645 History  This chart was scribed for non-physician practitioner, Trixie DredgeEmily Caileen Veracruz, PA-C, working with Loren Raceravid Yelverton, MD, by Bronson CurbJacqueline Melvin, ED Scribe. This patient was seen in room TR11C/TR11C and the patient's care was started at 6:17 PM.   Chief Complaint  Patient presents with  . Finger Injury    The history is provided by the patient. No language interpreter was used.     HPI Comments: Brittney Salazar is a 28 y.o. female who presents to the Emergency Department complaining of left middle finger injury that occurred approximately 12 hours ago. Patient states she was "playing around" with her significant other when her artificial nail and her real nail detached from her nail bed. There is associated moderate, nonradiating, constant throbbing pain to the left middle finger. She denies any other injury. Last tetanus 2005.  No weakness or numbness of the finger.    Past Medical History  Diagnosis Date  . Hx of metrorrhagia    History reviewed. No pertinent past surgical history. History reviewed. No pertinent family history. History  Substance Use Topics  . Smoking status: Current Every Day Smoker    Types: Cigars  . Smokeless tobacco: Never Used  . Alcohol Use: No   OB History    No data available     Review of Systems  Constitutional: Negative for fever.  Musculoskeletal: Negative for arthralgias.  Skin: Positive for wound. Negative for color change.  Allergic/Immunologic: Negative for immunocompromised state.  Neurological: Negative for weakness and numbness.  Hematological: Does not bruise/bleed easily.  Psychiatric/Behavioral: Negative for self-injury.      Allergies  Ciprofloxacin  Home Medications   Prior to Admission medications   Medication Sig Start Date End Date Taking? Authorizing Provider  clomiPHENE (CLOMID) 50 MG tablet Take 1 tablet (50 mg total) by mouth daily. Takes for 5 days starting on day 3 of  her menstrual cycle. 08/08/11   Purcell NailsAngela Y Roberts, MD   Triage Vitals: BP 120/86 mmHg  Pulse 88  Temp(Src) 98.5 F (36.9 C) (Oral)  Resp 20  Ht 5\' 4"  (1.626 m)  Wt 195 lb (88.451 kg)  BMI 33.46 kg/m2  SpO2 100%  LMP 04/06/2014  Physical Exam  Constitutional: She appears well-developed and well-nourished. No distress.  HENT:  Head: Normocephalic and atraumatic.  Neck: Neck supple.  Pulmonary/Chest: Effort normal.  Musculoskeletal:  Right 3rd finger with small amount of dried blood around edge of nail bed. Distal sensation intact. Cap refill <2. No erythema, edema, or warmth.  With digits anesthetized, able to lift approximately 2/3 nail off nailbed.  Nail is attached proximally and into the nailmatrix.   Hemostatic.  No FB.     Neurological: She is alert.  Skin: She is not diaphoretic.  Nursing note and vitals reviewed.   ED Course  Procedures (including critical care time)  DIAGNOSTIC STUDIES: Oxygen Saturation is 100% on room air, normal by my interpretation.    COORDINATION OF CARE: At 1821 Discussed treatment plan with patient. Patient agrees.    Labs Review Labs Reviewed - No data to display  Imaging Review No results found.   EKG Interpretation None       LACERATION REPAIR Performed by: Trixie DredgeWEST, Tamari Redwine Authorized by: Trixie DredgeWEST, Zackry Deines Consent: Verbal consent obtained. Risks and benefits: risks, benefits and alternatives were discussed Consent given by: patient Patient identity confirmed: provided demographic data Prepped and Draped in normal sterile fashion Wound explored  Laceration  Location: left 3rd finger partial nail avulsion  Laceration Length: n/a  No Foreign Bodies seen or palpated  Anesthesia: digital block  Local anesthetic: lidocaine 2% no epinephrine  Anesthetic total: 3 ml  Irrigation method: syringe Amount of cleaning: standard  Skin closure: 4-0 vicryl  Number of sutures: 2  Technique: simple interrupted through the edge of each  side of the nail to tack the nail down and hold it into place.  Additionally, I trimmed the very long nail to a shorter length with patient's permission.   Patient tolerance: Patient tolerated the procedure well with no immediate complications.  MDM   Final diagnoses:  Nail avulsion, finger, initial encounter    Afebrile, nontoxic patient with partial fingernail avulsion.  The proximal aspect of the nail was still attached so I attempted to save the nail by suturing the nail edges down to the finger with absorbable suture.  The very long nail was trimmed and the wound was dressed and a finger splint applied to protect the finger. Tetanus vx updated.   D/C home with keflex, pain medication.  Discussed result, findings, treatment, and follow up  with patient.  Pt given return precautions.  Pt verbalizes understanding and agrees with plan.       I personally performed the services described in this documentation, which was scribed in my presence. The recorded information has been reviewed and is accurate.    Trixie Dredgemily Deon Ivey, PA-C 04/20/14 2118  Loren Raceravid Yelverton, MD 04/20/14 [phone]

## 2014-04-21 ENCOUNTER — Telehealth: Payer: Self-pay | Admitting: Surgery

## 2014-04-24 NOTE — Telephone Encounter (Signed)
Received call from patient regarding a work note. Spoke with E. Shelva Majestic PA-C  work note printed., patient made aware to come to the ED to pick up note. No further ED CM needs identified.

## 2015-05-20 ENCOUNTER — Emergency Department (HOSPITAL_COMMUNITY)
Admission: EM | Admit: 2015-05-20 | Discharge: 2015-05-20 | Disposition: A | Discharge status: Home / Self Care | Payer: BLUE CROSS/BLUE SHIELD | Attending: Emergency Medicine | Admitting: Emergency Medicine

## 2015-05-20 ENCOUNTER — Encounter (HOSPITAL_COMMUNITY): Payer: Self-pay

## 2015-05-20 DIAGNOSIS — Z792 Long term (current) use of antibiotics: Secondary | ICD-10-CM | POA: Diagnosis not present

## 2015-05-20 DIAGNOSIS — Z3202 Encounter for pregnancy test, result negative: Secondary | ICD-10-CM | POA: Insufficient documentation

## 2015-05-20 DIAGNOSIS — K644 Residual hemorrhoidal skin tags: Secondary | ICD-10-CM | POA: Diagnosis not present

## 2015-05-20 DIAGNOSIS — Z8742 Personal history of other diseases of the female genital tract: Secondary | ICD-10-CM | POA: Insufficient documentation

## 2015-05-20 DIAGNOSIS — F17210 Nicotine dependence, cigarettes, uncomplicated: Secondary | ICD-10-CM | POA: Insufficient documentation

## 2015-05-20 DIAGNOSIS — K6289 Other specified diseases of anus and rectum: Secondary | ICD-10-CM | POA: Diagnosis present

## 2015-05-20 LAB — URINALYSIS, ROUTINE W REFLEX MICROSCOPIC
Bilirubin Urine: NEGATIVE
Glucose, UA: NEGATIVE mg/dL
Hgb urine dipstick: NEGATIVE
Ketones, ur: NEGATIVE mg/dL
Leukocytes, UA: NEGATIVE
Nitrite: NEGATIVE
Protein, ur: NEGATIVE mg/dL
Specific Gravity, Urine: 1.03 (ref 1.005–1.030)
pH: 6 (ref 5.0–8.0)

## 2015-05-20 LAB — POC URINE PREG, ED: PREG TEST UR: NEGATIVE

## 2015-05-20 MED ORDER — HYDROCORTISONE 2.5 % RE CREA: TOPICAL_CREAM | RECTAL | Status: DC

## 2015-05-20 MED ORDER — DOCUSATE SODIUM 100 MG PO CAPS: 100.0000 mg | ORAL_CAPSULE | Freq: Two times a day (BID) | ORAL | Status: DC

## 2015-05-20 MED ORDER — TRAMADOL HCL 50 MG PO TABS: 50.0000 mg | ORAL_TABLET | Freq: Four times a day (QID) | ORAL | Status: DC | PRN

## 2015-05-20 NOTE — ED Provider Notes (Signed)
CSN: [id number]     Arrival date & time 05/20/15  0122 History  By signing my name below, I, Brittney Salazar, attest that this documentation has been prepared under the direction and in the presence of Brittney Crease, MD . Electronically Signed: Freida Salazar, Scribe. 05/20/2015. 2:16 AM.    Chief Complaint  Patient presents with  . Rectal Pain    The history is provided by the patient. No language interpreter was used.     HPI Comments:  Brittney Salazar is a 30 y.o. female who presents to the Emergency Department complaining of a "sore spot" to her rectum first noticed today, however, pt states she has had mild pain at the site for 2 days. Pt notes the pain was preceded by multiple episodes of diarrhea; diarrhea has resolved. She also reports diffuse abdominal pain x 5 days that radiates around to her back. She denies fever, vomiting, and dysuria. No alleviating factors noted.   Past Medical History  Diagnosis Date  . Hx of metrorrhagia    History reviewed. No pertinent past surgical history. No family history on file. Social History  Substance Use Topics  . Smoking status: Current Every Day Smoker    Types: Cigars  . Smokeless tobacco: Never Used  . Alcohol Use: No   OB History    No data available     Review of Systems  Constitutional: Negative for fever.  Gastrointestinal: Positive for diarrhea and rectal pain. Negative for nausea and vomiting.  Genitourinary: Negative for dysuria.  All other systems reviewed and are negative.   Allergies  Ciprofloxacin  Home Medications   Prior to Admission medications   Medication Sig Start Date End Date Taking? Authorizing Provider  HYDROcodone-acetaminophen (NORCO/VICODIN) 5-325 MG per tablet Take 1 tablet by mouth every 4 (four) hours as needed. Patient taking differently: Take 1 tablet by mouth every 4 (four) hours as needed for moderate pain.  04/20/14  Yes Trixie Dredge, PA-C  ibuprofen (ADVIL,MOTRIN) 600 MG tablet Take  600 mg by mouth every 6 (six) hours as needed for moderate pain.   Yes Historical Provider, MD  penicillin v potassium (VEETID) 500 MG tablet Take 500 mg by mouth every 6 (six) hours.   Yes Historical Provider, MD   BP 136/108 mmHg  Pulse 91  Temp(Src) 98.2 F (36.8 C)  Resp 20  SpO2 99%  LMP 04/23/2015 Physical Exam  Constitutional: She is oriented to person, place, and time. She appears well-developed and well-nourished. No distress.  HENT:  Head: Normocephalic and atraumatic.  Right Ear: Hearing normal.  Left Ear: Hearing normal.  Nose: Nose normal.  Mouth/Throat: Oropharynx is clear and moist and mucous membranes are normal.  Eyes: Conjunctivae and EOM are normal. Pupils are equal, round, and reactive to light.  Neck: Normal range of motion. Neck supple.  Cardiovascular: Regular rhythm, S1 normal and S2 normal.  Exam reveals no gallop and no friction rub.   No murmur heard. Pulmonary/Chest: Effort normal and breath sounds normal. No respiratory distress. She exhibits no tenderness.  Abdominal: Soft. Normal appearance and bowel sounds are normal. There is no hepatosplenomegaly. There is no tenderness. There is no rebound, no guarding, no tenderness at McBurney's point and negative Murphy's sign. No hernia.  Genitourinary:  Non-thrombosed external hemorrhage at 6 o'clock  Chaperone (scribe) was present for exam which was performed with no discomfort or complications.   Musculoskeletal: Normal range of motion.  Neurological: She is alert and oriented to person, place, and time.  She has normal strength. No cranial nerve deficit or sensory deficit. Coordination normal. GCS eye subscore is 4. GCS verbal subscore is 5. GCS motor subscore is 6.  Skin: Skin is warm, dry and intact. No rash noted. No cyanosis.  Psychiatric: She has a normal mood and affect. Her speech is normal and behavior is normal. Thought content normal.  Nursing note and vitals reviewed.   ED Course  Procedures    DIAGNOSTIC STUDIES:  Oxygen Saturation is 99% on RA, normal by my interpretation.    COORDINATION OF CARE:  2:08 AM Will order UA. Discussed treatment plan with pt at bedside and pt agreed to plan.  Labs Review Labs Reviewed  URINALYSIS, ROUTINE W REFLEX MICROSCOPIC (NOT AT [facility])  POC URINE PREG, ED   I have personally reviewed and evaluated these lab results as part of my medical decision-making.     MDM   Final diagnoses:  External hemorrhoid    She presents to the ER for evaluation of a painful lump near her rectum. Patient reports that she did recently have a GI bug, suffering diarrhea. This has resolved. Since then she has had some discomfort in her bilateral flank area and has noticed pain near the rectum followed by developing of the lump today. Examination reveals external hemorrhoid that is nonthrombosed. This will be treated with Anusol HC, analgesia, stool softener. Patient has a benign, nontender abdomen. Urinalysis performed to rule out urinary tract infection. This was normal.  I personally performed the services described in this documentation, which was scribed in my presence. The recorded information has been reviewed and is accurate.    Brittney Crease, MD 05/20/15 [phone]

## 2015-05-20 NOTE — ED Notes (Signed)
Pt has a sore area to her rectum that she thought was from her being sick. States it is the size of a pea just below the rectum. Has soreness to bilateral rib area for a couple days with no known injury.

## 2015-05-20 NOTE — Discharge Instructions (Signed)

## 2015-10-29 ENCOUNTER — Emergency Department (HOSPITAL_COMMUNITY)
Admission: EM | Admit: 2015-10-29 | Discharge: 2015-10-29 | Disposition: A | Discharge status: Home / Self Care | Payer: BLUE CROSS/BLUE SHIELD | Attending: Emergency Medicine | Admitting: Emergency Medicine

## 2015-10-29 ENCOUNTER — Encounter (HOSPITAL_COMMUNITY): Payer: Self-pay | Admitting: Emergency Medicine

## 2015-10-29 ENCOUNTER — Emergency Department (HOSPITAL_COMMUNITY): Payer: BLUE CROSS/BLUE SHIELD

## 2015-10-29 DIAGNOSIS — R52 Pain, unspecified: Secondary | ICD-10-CM

## 2015-10-29 DIAGNOSIS — Z7982 Long term (current) use of aspirin: Secondary | ICD-10-CM | POA: Diagnosis not present

## 2015-10-29 DIAGNOSIS — A599 Trichomoniasis, unspecified: Secondary | ICD-10-CM

## 2015-10-29 DIAGNOSIS — N739 Female pelvic inflammatory disease, unspecified: Secondary | ICD-10-CM | POA: Insufficient documentation

## 2015-10-29 DIAGNOSIS — N730 Acute parametritis and pelvic cellulitis: Secondary | ICD-10-CM

## 2015-10-29 DIAGNOSIS — F17210 Nicotine dependence, cigarettes, uncomplicated: Secondary | ICD-10-CM | POA: Diagnosis not present

## 2015-10-29 LAB — CBC WITH DIFFERENTIAL/PLATELET
BASOS ABS: 0 10*3/uL (ref 0.0–0.1)
BASOS PCT: 0 %
EOS ABS: 0 10*3/uL (ref 0.0–0.7)
Eosinophils Relative: 0 %
HEMATOCRIT: 37.8 % (ref 36.0–46.0)
Hemoglobin: 12.5 g/dL (ref 12.0–15.0)
Lymphocytes Relative: 21 %
Lymphs Abs: 1.3 10*3/uL (ref 0.7–4.0)
MCH: 28 pg (ref 26.0–34.0)
MCHC: 33.1 g/dL (ref 30.0–36.0)
MCV: 84.8 fL (ref 78.0–100.0)
MONOS PCT: 6 %
Monocytes Absolute: 0.4 10*3/uL (ref 0.1–1.0)
NEUTROS ABS: 4.4 10*3/uL (ref 1.7–7.7)
Neutrophils Relative %: 73 %
Platelets: 201 10*3/uL (ref 150–400)
RBC: 4.46 MIL/uL (ref 3.87–5.11)
RDW: 13.5 % (ref 11.5–15.5)
WBC: 6.1 10*3/uL (ref 4.0–10.5)

## 2015-10-29 LAB — I-STAT CHEM 8, ED
BUN: 20 mg/dL (ref 6–20)
CREATININE: 0.9 mg/dL (ref 0.44–1.00)
Calcium, Ion: 1.14 mmol/L (ref 1.13–1.30)
Chloride: 104 mmol/L (ref 101–111)
Glucose, Bld: 97 mg/dL (ref 65–99)
HEMATOCRIT: 42 % (ref 36.0–46.0)
HEMOGLOBIN: 14.3 g/dL (ref 12.0–15.0)
POTASSIUM: 4.3 mmol/L (ref 3.5–5.1)
SODIUM: 139 mmol/L (ref 135–145)
TCO2: 25 mmol/L (ref 0–100)

## 2015-10-29 LAB — URINALYSIS, ROUTINE W REFLEX MICROSCOPIC
Bilirubin Urine: NEGATIVE
GLUCOSE, UA: NEGATIVE mg/dL
KETONES UR: NEGATIVE mg/dL
NITRITE: NEGATIVE
Protein, ur: NEGATIVE mg/dL
Specific Gravity, Urine: 1.022 (ref 1.005–1.030)
pH: 6.5 (ref 5.0–8.0)

## 2015-10-29 LAB — WET PREP, GENITAL
CLUE CELLS WET PREP: NONE SEEN
Sperm: NONE SEEN
YEAST WET PREP: NONE SEEN

## 2015-10-29 LAB — URINE MICROSCOPIC-ADD ON

## 2015-10-29 LAB — RAPID HIV SCREEN (HIV 1/2 AB+AG)
HIV 1/2 ANTIBODIES: NONREACTIVE
HIV-1 P24 Antigen - HIV24: NONREACTIVE

## 2015-10-29 LAB — I-STAT CG4 LACTIC ACID, ED: Lactic Acid, Venous: 0.65 mmol/L (ref 0.5–1.9)

## 2015-10-29 LAB — POC URINE PREG, ED: Preg Test, Ur: NEGATIVE

## 2015-10-29 MED ORDER — METRONIDAZOLE 500 MG PO TABS
2000.0000 mg | ORAL_TABLET | Freq: Once | ORAL | Status: AC
  Administered 2015-10-29: 2000 mg via ORAL
  Filled 2015-10-29: qty 4

## 2015-10-29 MED ORDER — AZITHROMYCIN 250 MG PO TABS
1000.0000 mg | ORAL_TABLET | Freq: Once | ORAL | Status: AC
  Administered 2015-10-29: 1000 mg via ORAL
  Filled 2015-10-29: qty 4

## 2015-10-29 MED ORDER — CEFTRIAXONE SODIUM 250 MG IJ SOLR
250.0000 mg | Freq: Once | INTRAMUSCULAR | Status: AC
  Administered 2015-10-29: 250 mg via INTRAMUSCULAR
  Filled 2015-10-29: qty 250

## 2015-10-29 MED ORDER — LIDOCAINE HCL 1 % IJ SOLN
INTRAMUSCULAR | Status: AC
  Administered 2015-10-29: 2 mL
  Filled 2015-10-29: qty 20

## 2015-10-29 MED ORDER — MORPHINE SULFATE (PF) 4 MG/ML IV SOLN
4.0000 mg | Freq: Once | INTRAVENOUS | Status: AC
  Administered 2015-10-29: 4 mg via INTRAVENOUS
  Filled 2015-10-29: qty 1

## 2015-10-29 MED ORDER — IBUPROFEN 800 MG PO TABS: 800.0000 mg | ORAL_TABLET | Freq: Three times a day (TID) | ORAL | Status: DC | PRN

## 2015-10-29 MED ORDER — DOXYCYCLINE HYCLATE 100 MG PO CAPS: 100.0000 mg | ORAL_CAPSULE | Freq: Two times a day (BID) | ORAL | Status: DC

## 2015-10-29 MED ORDER — METRONIDAZOLE 500 MG PO TABS: 2000.0000 mg | ORAL_TABLET | Freq: Once | ORAL | Status: DC

## 2015-10-29 MED ORDER — SODIUM CHLORIDE 0.9 % IV BOLUS (SEPSIS)
1000.0000 mL | Freq: Once | INTRAVENOUS | Status: AC
  Administered 2015-10-29: 1000 mL via INTRAVENOUS

## 2015-10-29 NOTE — ED Notes (Signed)
Pt complaint of generalized body aches, lower abdominal pain, chills, emesis, and headache. Pt continued to report possible pregnancy and recent spotting; last menstrual 5/21.

## 2015-10-29 NOTE — Discharge Instructions (Signed)
Take ibuprofen as needed for pain.  Take doxycycline as prescribed as treatment for PID.  Give Flagyl 2g (4 pills) to your partner as treatment for potential cross infection.   Pelvic Inflammatory Disease Pelvic inflammatory disease (PID) is an infection in some or all of the female organs. PID can be in the uterus, ovaries, fallopian tubes, or the surrounding tissues that are inside the lower belly area (pelvis). PID can lead to lasting problems if it is not treated. To check for this disease, your doctor may:  Do a physical exam.  Do blood tests, urine tests, or a pregnancy test.  Look at your vaginal discharge.  Do tests to look inside the pelvis.  Test you for other infections. HOME CARE  Take over-the-counter and prescription medicines only as told by your doctor.  If you were prescribed an antibiotic medicine, take it as told by your doctor. Do not stop taking it even if you start to feel better.  Do not have sex until treatment is done or as told by your doctor.  Tell your sex partner if you have PID. Your partner may need to be treated.  Keep all follow-up visits as told by your doctor. This is important.  Your doctor may test you for infection again 3 months after you are treated. GET HELP IF:  You have more fluid (discharge) coming from your vagina or fluid that is not normal.  Your pain does not improve.  You throw up (vomit).  You have a fever.  You cannot take your medicines.  Your partner has a sexually transmitted disease (STD).  You have pain when you pee (urinate). GET HELP RIGHT AWAY IF:  You have more belly (abdominal) or lower belly pain.  You have chills.  You are not better after 72 hours.   This information is not intended to replace advice given to you by your health care provider. Make sure you discuss any questions you have with your health care provider.   Document Released: 07/06/2008 Document Revised: 12/29/2014 Document Reviewed:  05/17/2014 Elsevier Interactive Patient Education 2016 [employer]   Trichomoniasis Trichomoniasis is an infection caused by an organism called Trichomonas. The infection can affect both women and men. In women, the outer female genitalia and the vagina are affected. In men, the penis is mainly affected, but the prostate and other reproductive organs can also be involved. Trichomoniasis is a sexually transmitted infection (STI) and is most often passed to another person through sexual contact.  RISK FACTORS  Having unprotected sexual intercourse.  Having sexual intercourse with an infected partner. SIGNS AND SYMPTOMS  Symptoms of trichomoniasis in women include:  Abnormal gray-green frothy vaginal discharge.  Itching and irritation of the vagina.  Itching and irritation of the area outside the vagina. Symptoms of trichomoniasis in men include:   Penile discharge with or without pain.  Pain during urination. This results from inflammation of the urethra. DIAGNOSIS  Trichomoniasis may be found during a Pap test or physical exam. Your health care provider may use one of the following methods to help diagnose this infection:  Testing the pH of the vagina with a test tape.  Using a vaginal swab test that checks for the Trichomonas organism. A test is available that provides results within a few minutes.  Examining a urine sample.  Testing vaginal secretions. Your health care provider may test you for other STIs, including HIV. TREATMENT   You may be given medicine to fight the infection. Women should  inform their health care provider if they could be or are pregnant. Some medicines used to treat the infection should not be taken during pregnancy.  Your health care provider may recommend over-the-counter medicines or creams to decrease itching or irritation.  Your sexual partner will need to be treated if infected.  Your health care provider may test you for infection again 3  months after treatment. HOME CARE INSTRUCTIONS   Take medicines only as directed by your health care provider.  Take over-the-counter medicine for itching or irritation as directed by your health care provider.  Do not have sexual intercourse while you have the infection.  Women should not douche or wear tampons while they have the infection.  Discuss your infection with your partner. Your partner may have gotten the infection from you, or you may have gotten it from your partner.  Have your sex partner get examined and treated if necessary.  Practice safe, informed, and protected sex.  See your health care provider for other STI testing. SEEK MEDICAL CARE IF:   You still have symptoms after you finish your medicine.  You develop abdominal pain.  You have pain when you urinate.  You have bleeding after sexual intercourse.  You develop a rash.  Your medicine makes you sick or makes you throw up (vomit). MAKE SURE YOU:  Understand these instructions.  Will watch your condition.  Will get help right away if you are not doing well or get worse.   This information is not intended to replace advice given to you by your health care provider. Make sure you discuss any questions you have with your health care provider.   Document Released: 10/03/2000 Document Revised: 04/30/2014 Document Reviewed: 01/19/2013 Elsevier Interactive Patient Education [employer]

## 2015-10-29 NOTE — ED Notes (Signed)
Pt c/o irregular periods for two months but now has blood clots but "not really a period." Pt adds that she is urinating more but denies dysuria. Pt denies vaginal discharge or odor. Pt LMP 09/11/15. Pt sts that she vomited this am. Pt adds that she had a cough last pm. Pt also c/o chills last night. Pt is A&O and in NAD

## 2015-10-29 NOTE — ED Provider Notes (Signed)
CSN: [id number]     Arrival date & time 10/29/15  0730 History   First MD Initiated Contact with Patient 10/29/15 [phone]     Chief Complaint  Patient presents with  . Generalized Body Aches  . Possible Pregnancy     (Consider location/radiation/quality/duration/timing/severity/associated sxs/prior Treatment) HPI   30 year old female with history of Metrorrhagi presenting today with multiple complaints. Patient reports she is usually pretty regular with her menstruation. Her last menstrual period was May 21. For the past week she has had vaginal spotting which is new with occasional clots. She change her pads once or twice daily. She also complaining of generalized body aches, having chills, abdominal pain, dysuria with urinary frequency, urgency, and occasionally burning sensation while urinating. For the past 2 days she also expressing tenderness to her right flank of mild to moderate in intensity and described as a pins and needles sensation. Despite taking Excedrin pain medication, her symptoms hasn't improved. She has never been pregnant before. She did voice concern for potential pregnancy. She also endorsed nonproductive cough for the past week but cough is mild. No other URI symptoms.  Past Medical History  Diagnosis Date  . Hx of metrorrhagia    History reviewed. No pertinent past surgical history. No family history on file. Social History  Substance Use Topics  . Smoking status: Current Every Day Smoker -- 0.25 packs/day    Types: Cigarettes  . Smokeless tobacco: Never Used  . Alcohol Use: No   OB History    No data available     Review of Systems  All other systems reviewed and are negative.     Allergies  Ciprofloxacin  Home Medications   Prior to Admission medications   Medication Sig Start Date End Date Taking? Authorizing Provider  docusate sodium (COLACE) 100 MG capsule Take 1 capsule (100 mg total) by mouth every 12 (twelve) hours. 05/20/15   Gilda Crease, MD  HYDROcodone-acetaminophen (NORCO/VICODIN) 5-325 MG per tablet Take 1 tablet by mouth every 4 (four) hours as needed. Patient taking differently: Take 1 tablet by mouth every 4 (four) hours as needed for moderate pain.  04/20/14   Trixie Dredge, PA-C  hydrocortisone (ANUSOL-HC) 2.5 % rectal cream Apply rectally 2 times daily 05/20/15   Gilda Crease, MD  ibuprofen (ADVIL,MOTRIN) 600 MG tablet Take 600 mg by mouth every 6 (six) hours as needed for moderate pain.    Historical Provider, MD  penicillin v potassium (VEETID) 500 MG tablet Take 500 mg by mouth every 6 (six) hours.    Historical Provider, MD  traMADol (ULTRAM) 50 MG tablet Take 1 tablet (50 mg total) by mouth every 6 (six) hours as needed. 05/20/15   Gilda Crease, MD   BP 112/82 mmHg  Pulse 84  Temp(Src) 99.8 F (37.7 C) (Oral)  Resp 16  SpO2 98%  LMP 09/11/2015 Physical Exam  Constitutional: She appears well-developed and well-nourished. No distress.  African-American female laying in bed in no acute discomfort, nontoxic in appearance  HENT:  Head: Atraumatic.  Mouth/Throat: Oropharynx is clear and moist.  Eyes: Conjunctivae are normal.  Neck: Neck supple.  Cardiovascular: Normal rate and regular rhythm.   Pulmonary/Chest: Effort normal and breath sounds normal.  Abdominal: Soft. Bowel sounds are normal. She exhibits no distension. There is tenderness (Mild generalized diffuse tenderness without guarding or rebound tenderness.).  Genitourinary:  Chaperone present during exam. No inguinal lymphadenopathy or inguinal hernia noted. Normal external genitalia. Significant discomfort with the insertion of  speculum. Moderate amount of blood noted in vaginal vault. Unable to fully insert the speculum due to patient's discomfort. Unable to fully visualize cervical os. No obvious discharge noted. On bimanual examination, adnexal tenderness bilaterally with cervical motion tenderness.  Neurological: She is alert.   Skin: Skin is warm. No rash noted.  Psychiatric: She has a normal mood and affect.  Nursing note and vitals reviewed.   ED Course  Procedures (including critical care time) Labs Review Labs Reviewed  WET PREP, GENITAL - Abnormal; Notable for the following:    Trich, Wet Prep PRESENT (*)    WBC, Wet Prep HPF POC MODERATE (*)    All other components within normal limits  URINALYSIS, ROUTINE W REFLEX MICROSCOPIC (NOT AT [facility]) - Abnormal; Notable for the following:    Hgb urine dipstick LARGE (*)    Leukocytes, UA SMALL (*)    All other components within normal limits  URINE MICROSCOPIC-ADD ON - Abnormal; Notable for the following:    Squamous Epithelial / LPF 0-5 (*)    Bacteria, UA MANY (*)    All other components within normal limits  RAPID HIV SCREEN (HIV 1/2 AB+AG)  CBC WITH DIFFERENTIAL/PLATELET  RPR  POC URINE PREG, ED  I-STAT CHEM 8, ED  I-STAT CG4 LACTIC ACID, ED  GC/CHLAMYDIA PROBE AMP ([place]) NOT AT [facility]    Imaging Review US Transvaginal Non-ob  10/29/2015  CLINICAL DATA:  Menometrorrhagia, pelvic pain for 1 week EXAM: TRANSABDOMINAL AND TRANSVAGINAL ULTRASOUND OF PELVIS DOPPLER ULTRASOUND OF OVARIES TECHNIQUE: Both transabdominal and transvaginal ultrasound examinations of the pelvis were performed. Transabdominal technique was performed for global imaging of the pelvis including uterus, ovaries, adnexal regions, and pelvic cul-de-sac. It was necessary to proceed with endovaginal exam following the transabdominal exam to visualize the endometrium and adnexa. Color and duplex Doppler ultrasound was utilized to evaluate blood flow to the ovaries. COMPARISON:  06/16/2009 FINDINGS: Uterus Measurements: 7.6 x 2.7 x 3.9 cm. Normal morphology without mass Endometrium Thickness: 3 mm thick, normal. No definite endometrial fluid or focal abnormality Right ovary Measurements: 3.5 x 2.1 x 2.8 cm. Normal morphology without mass. Internal blood flow present within RIGHT ovary on  color Doppler imaging. Left ovary Measurements: 3.8 x 3.2 x 3.2 cm. Complex hypoechoic area within LEFT ovary, 2.5 x 1.7 x 2.0 cm. Blood flow is seen within LEFT ovary but not within the complex hypoechoic area on color Doppler imaging. This could represent a hemorrhagic cyst though solid mass not completely excluded. Pulsed Doppler evaluation of both ovaries demonstrates normal low-resistance arterial and venous waveforms. Other findings Trace free pelvic fluid.  No additional adnexal masses. IMPRESSION: Unremarkable uterus and RIGHT ovary. Complex hypoechoic nodule LEFT ovary 2.5 x 1.7 x 2.0 cm favoring hemorrhagic cyst over solid nodule; followup transvaginal ultrasound recommended in 6-8 weeks to reassess this lesion and exclude ovarian tumor. Electronically Signed   By: Ulyses Southward M.D.   On: 10/29/2015 10:02   US Pelvis Complete  10/29/2015  CLINICAL DATA:  Menometrorrhagia, pelvic pain for 1 week EXAM: TRANSABDOMINAL AND TRANSVAGINAL ULTRASOUND OF PELVIS DOPPLER ULTRASOUND OF OVARIES TECHNIQUE: Both transabdominal and transvaginal ultrasound examinations of the pelvis were performed. Transabdominal technique was performed for global imaging of the pelvis including uterus, ovaries, adnexal regions, and pelvic cul-de-sac. It was necessary to proceed with endovaginal exam following the transabdominal exam to visualize the endometrium and adnexa. Color and duplex Doppler ultrasound was utilized to evaluate blood flow to the ovaries. COMPARISON:  06/16/2009 FINDINGS: Uterus  Measurements: 7.6 x 2.7 x 3.9 cm. Normal morphology without mass Endometrium Thickness: 3 mm thick, normal. No definite endometrial fluid or focal abnormality Right ovary Measurements: 3.5 x 2.1 x 2.8 cm. Normal morphology without mass. Internal blood flow present within RIGHT ovary on color Doppler imaging. Left ovary Measurements: 3.8 x 3.2 x 3.2 cm. Complex hypoechoic area within LEFT ovary, 2.5 x 1.7 x 2.0 cm. Blood flow is seen within  LEFT ovary but not within the complex hypoechoic area on color Doppler imaging. This could represent a hemorrhagic cyst though solid mass not completely excluded. Pulsed Doppler evaluation of both ovaries demonstrates normal low-resistance arterial and venous waveforms. Other findings Trace free pelvic fluid.  No additional adnexal masses. IMPRESSION: Unremarkable uterus and RIGHT ovary. Complex hypoechoic nodule LEFT ovary 2.5 x 1.7 x 2.0 cm favoring hemorrhagic cyst over solid nodule; followup transvaginal ultrasound recommended in 6-8 weeks to reassess this lesion and exclude ovarian tumor. Electronically Signed   By: Ulyses SouthwardMark  Boles M.D.   On: 10/29/2015 10:02   [place] Art/ven Flow Abd Pelv Doppler  10/29/2015  CLINICAL DATA:  Menometrorrhagia, pelvic pain for 1 week EXAM: TRANSABDOMINAL AND TRANSVAGINAL ULTRASOUND OF PELVIS DOPPLER ULTRASOUND OF OVARIES TECHNIQUE: Both transabdominal and transvaginal ultrasound examinations of the pelvis were performed. Transabdominal technique was performed for global imaging of the pelvis including uterus, ovaries, adnexal regions, and pelvic cul-de-sac. It was necessary to proceed with endovaginal exam following the transabdominal exam to visualize the endometrium and adnexa. Color and duplex Doppler ultrasound was utilized to evaluate blood flow to the ovaries. COMPARISON:  06/16/2009 FINDINGS: Uterus Measurements: 7.6 x 2.7 x 3.9 cm. Normal morphology without mass Endometrium Thickness: 3 mm thick, normal. No definite endometrial fluid or focal abnormality Right ovary Measurements: 3.5 x 2.1 x 2.8 cm. Normal morphology without mass. Internal blood flow present within RIGHT ovary on color Doppler imaging. Left ovary Measurements: 3.8 x 3.2 x 3.2 cm. Complex hypoechoic area within LEFT ovary, 2.5 x 1.7 x 2.0 cm. Blood flow is seen within LEFT ovary but not within the complex hypoechoic area on color Doppler imaging. This could represent a hemorrhagic cyst though solid mass not  completely excluded. Pulsed Doppler evaluation of both ovaries demonstrates normal low-resistance arterial and venous waveforms. Other findings Trace free pelvic fluid.  No additional adnexal masses. IMPRESSION: Unremarkable uterus and RIGHT ovary. Complex hypoechoic nodule LEFT ovary 2.5 x 1.7 x 2.0 cm favoring hemorrhagic cyst over solid nodule; followup transvaginal ultrasound recommended in 6-8 weeks to reassess this lesion and exclude ovarian tumor. Electronically Signed   By: Ulyses SouthwardMark  Boles M.D.   On: 10/29/2015 10:02   I have personally reviewed and evaluated these images and lab results as part of my medical decision-making.   EKG Interpretation None      MDM   Final diagnoses:  PID (acute pelvic inflammatory disease)  Trichomonal infection    BP 109/60 mmHg  Pulse 67  Temp(Src) 100.2 F (37.9 C) (Rectal)  Resp 18  SpO2 99%  LMP 09/11/2015   8:20 AM Patient presents with dysuria, right flank pain, body aches, concerning for potential UTI/pyelonephritis. She is afebrile, vital signs stable and nontoxic in appearance. Symptoms not suggestive of kidney stone. Patient was concern for potential pregnancy due to missing her regular menstruation and now having vaginal spotting. Will check pregnancy test, workup initiated.  9:07 AM Patient has significant discomfort on pelvic examination. Examination is limited due to her discomfort. She does have bilateral adnexal tenderness and cervical motion  tenderness therefore, I offer antibiotic to treat for STD prophylaxis including Rocephin and Zithromax, patient agrees. Given her discomfort, plan to obtain pelvic ultrasound to rule out ovarian torsion, or tubo-ovarian abscess. Screening lab tests ordered, IV fluid and pain medication given.  Her pregnancy test is negative.  10:32 AM Normal WBC, electrolytes are normal, normal lactic acid. Wet prep shows evidence of trichomonas as well as moderate WBC. HIV test is negative. Her urine shows  large amount of hemoglobin urine dipstick, small amount of leukocyte esterase and many bacteria with 6-30 WBC. Her pelvic ultrasound demonstrated a complex hypoechoic nodule in the left ovary approximately 2 cm in diameter. The is favoring a hemorrhagic cyst over a solid nodule. I suspect that this is the source of her vaginal bleeding. It is recommended for patient to follow-up either in 6-8 weeks for reassessment of this lesion to exclude ovarian tumor.  I did discuss the finding with patient. She'll be treated for Trichomonas with Flagyl 2 g by mouth once. Patient will be discharged with doxycycline for  10 days as treatment for PID.  I encouraged partner to be tested and treated as well. I will prescribe another dose of Flagyl 2 g so patient can give it to her partner does increased risks of compliant and decreased risks of reinfection. Patient voiced understanding and agrees with plan.  Fayrene Helper, PA-C 10/29/15 1041  Gerhard Munch, MD 10/30/15 [phone]

## 2015-10-30 LAB — RPR: RPR: NONREACTIVE

## 2015-10-31 LAB — GC/CHLAMYDIA PROBE AMP (~~LOC~~) NOT AT ARMC
Chlamydia: NEGATIVE
Neisseria Gonorrhea: NEGATIVE

## 2015-11-08 ENCOUNTER — Encounter: Payer: Self-pay | Admitting: Obstetrics & Gynecology

## 2015-11-08 ENCOUNTER — Ambulatory Visit (INDEPENDENT_AMBULATORY_CARE_PROVIDER_SITE_OTHER): Payer: BLUE CROSS/BLUE SHIELD | Admitting: Obstetrics & Gynecology

## 2015-11-08 VITALS — BP 113/78 | HR 83 | Resp 18 | Ht 66.0 in | Wt 242.0 lb

## 2015-11-08 DIAGNOSIS — N970 Female infertility associated with anovulation: Secondary | ICD-10-CM

## 2015-11-08 NOTE — Progress Notes (Signed)
Patient ID: Brittney Salazar, female   DOB: 05/14/1985, 30 y.o.   MRN: [id number]  Chief Complaint  Patient presents with  . ER Follow-up  treated for trichomonas  HPI Brittney Salazar is a 30 y.o. female.  [id number] Patient's last menstrual period was 09/11/2015. Seen in ED for vaginal irritation and was treated for trichomonas, with relief of symptoms, no c/o today. Previously infertility patient at [facility], no HSG or SA was done, received Clomid and injections.  HPI  Past Medical History  Diagnosis Date  . Hx of metrorrhagia     Past Surgical History  Procedure Laterality Date  . Wisdom tooth extraction      Family History  Problem Relation Age of Onset  . Hypertension Mother   . Arthritis Father     Social History Social History  Substance Use Topics  . Smoking status: Current Every Day Smoker -- 0.25 packs/day    Types: Cigarettes  . Smokeless tobacco: Never Used  . Alcohol Use: No    Allergies  Allergen Reactions  . Ciprofloxacin Hives    Current Outpatient Prescriptions  Medication Sig Dispense Refill  . aspirin-acetaminophen-caffeine (EXCEDRIN MIGRAINE) 250-250-65 MG tablet Take 2 tablets by mouth every 6 (six) hours as needed for headache.    . doxycycline (VIBRAMYCIN) 100 MG capsule Take 1 capsule (100 mg total) by mouth 2 (two) times daily. 20 capsule 0  . ibuprofen (ADVIL,MOTRIN) 800 MG tablet Take 1 tablet (800 mg total) by mouth every 8 (eight) hours as needed for moderate pain. (Patient not taking: Reported on 11/08/2015) 21 tablet 0   No current facility-administered medications for this visit.    Review of Systems Review of Systems  Constitutional: Negative.   Gastrointestinal: Negative.   Genitourinary: Positive for menstrual problem (lmp late). Negative for vaginal bleeding, vaginal discharge, vaginal pain and pelvic pain.    Blood pressure 113/78, pulse 83, resp. rate 18, height 5\' 6"  (1.676 m), weight 242 lb (109.77 kg), last menstrual period  09/11/2015.  Physical Exam Physical Exam  Constitutional: She is oriented to person, place, and time. She appears well-developed. No distress.  Pulmonary/Chest: Effort normal. No respiratory distress.  Abdominal: Soft. She exhibits no distension. There is no tenderness.  Neurological: She is alert and oriented to person, place, and time.  Psychiatric: She has a normal mood and affect. Her behavior is normal.  Vitals reviewed.   Data Reviewed  CLINICAL DATA: Menometrorrhagia, pelvic pain for 1 week  EXAM: TRANSABDOMINAL AND TRANSVAGINAL ULTRASOUND OF PELVIS  DOPPLER ULTRASOUND OF OVARIES  TECHNIQUE: Both transabdominal and transvaginal ultrasound examinations of the pelvis were performed. Transabdominal technique was performed for global imaging of the pelvis including uterus, ovaries, adnexal regions, and pelvic cul-de-sac.  It was necessary to proceed with endovaginal exam following the transabdominal exam to visualize the endometrium and adnexa. Color and duplex Doppler ultrasound was utilized to evaluate blood flow to the ovaries.  COMPARISON: 06/16/2009  FINDINGS: Uterus  Measurements: 7.6 x 2.7 x 3.9 cm. Normal morphology without mass  Endometrium  Thickness: 3 mm thick, normal. No definite endometrial fluid or focal abnormality  Right ovary  Measurements: 3.5 x 2.1 x 2.8 cm. Normal morphology without mass. Internal blood flow present within RIGHT ovary on color Doppler imaging.  Left ovary  Measurements: 3.8 x 3.2 x 3.2 cm. Complex hypoechoic area within LEFT ovary, 2.5 x 1.7 x 2.0 cm. Blood flow is seen within LEFT ovary but not within the complex hypoechoic area on color Doppler imaging.  This could represent a hemorrhagic cyst though solid mass not completely excluded.  Pulsed Doppler evaluation of both ovaries demonstrates normal low-resistance arterial and venous waveforms.  Other findings  Trace free pelvic fluid. No  additional adnexal masses.  IMPRESSION: Unremarkable uterus and RIGHT ovary.  Complex hypoechoic nodule LEFT ovary 2.5 x 1.7 x 2.0 cm favoring hemorrhagic cyst over solid nodule; followup transvaginal ultrasound recommended in 6-8 weeks to reassess this lesion and exclude ovarian tumor.   Electronically Signed  By: Ulyses Southward M.D.  On: 10/29/2015 10:02  Assessment    S/P treatment for trichomonas, neg GC and CT Infertility Hemorrhagic cyst left ovary     Plan    Referral to Dr April Manson for infertility SA and HSG discussed        [name] 11/08/2015, 9:22 AM

## 2015-11-08 NOTE — Patient Instructions (Signed)
Infertility Infertility is when you are unable to get pregnant (conceive) after a year of having sex regularly without using birth control. Infertility can also mean that a woman is not able to carry a pregnancy to full term.  Both women and men can have fertility problems. WHAT CAUSES INFERTILITY? What Causes Infertility in Women? There are many possible causes of infertility in women. For some women, the cause of infertility is not known (unexplained infertility). Infertility can also be linked to more than one cause. Infertility problems in women can be caused by problems with the menstrual cycle or reproductive organs, certain medical conditions, and factors related to lifestyle and age.  Problems with your menstrual cycle can interfere with your ovaries producing eggs (ovulation). This can make it difficult to get pregnant. This includes having a menstrual cycle that is very long, very short, or irregular.  Problems with reproductive organs can include:  An abnormally narrow cervix or a cervix that does not remain closed during a pregnancy.  A blockage in your fallopian tubes.  An abnormally shaped uterus.  Uterine fibroids. This is a tissue mass (tumor) that can develop on your uterus.  Medical conditions that can affect a woman's fertility include:  Polycystic ovarian syndrome (PCOS). This is a hormonal disorder that can cause small cysts to grow on your ovaries. This is the most common cause of infertility in women.  Endometriosis. This is a condition in which the tissue that lines your uterus (endometrium) grows outside of its normal location.  Primary ovary insufficiency. This is when your ovaries stop producing eggs and hormones before the age of 40.  Sexually transmitted diseases, such as chlamydia or gonorrhea. These infections can cause scarring in your fallopian tubes. This makes it difficult for eggs to reach your uterus.  Autoimmune disorders. These are disorders in  which your immune system attacks normal, healthy cells.  Hormone imbalances.  Other factors include:  Age. A woman's fertility declines with age, especially after her mid-30s.  Being under- or overweight.  Drinking too much alcohol.  Using drugs.  Exercising excessively.  Being exposed to environmental toxins, such as radiation, pesticides, and certain chemicals. What Causes Infertility in Men? There are many causes of infertility in men. Infertility can be linked to more than one cause. Infertility problems in men can be caused by problems with sperm or the reproductive organs, certain medical conditions, and factors related to lifestyle and age. Some men have unexplained infertility.   Problems with sperm. Infertility can result if there is a problem producing:  Enough sperm (low sperm count).  Enough normally-shaped sperm (sperm morphology).  Sperm that are able to reach the egg (poor motility).  Infertility can also be caused by:  A problem with hormones.  Enlarged veins (varicoceles), cysts (spermatoceles), or tumors of the testicles.  Sexual dysfunction.  Injury to the testicles.  A birth defect, such as not having the tubes that carry sperm (vas deferens).  Medical conditions that can affect a man's fertility include:  Diabetes.  Cancer treatments, such as chemotherapy or radiation.  Klinefelter syndrome. This is an inherited genetic disorder.   Thyroid problems, such as an under- or overactive thyroid.  Cystic fibrosis.  Sexually transmitted diseases.  Other factors include:  Age. A man's fertility declines with age.  Drinking too much alcohol.  Using drugs.  Being exposed to environmental toxins, such as pesticides and lead. WHAT ARE THE SYMPTOMS OF INFERTILITY? Being unable to get pregnant after one year of having   regular sex without using birth control is the only sign of infertility.  HOW IS INFERTILITY DIAGNOSED? In order to be diagnosed  with infertility, both partners will have a physical exam. Both partners will also have an extensive medical and sexual history taken. If there is no obvious reason for infertility, additional tests may be done. What Tests Will Women Have? Women may first have tests to check whether they are ovulating each month. The tests may include:  Blood tests to check hormone levels.  An ultrasound of the ovaries. This looks for possible problems on or in the ovaries.  Taking a small sample of the tissue that lines the uterus for examination under a microscope (endometrial biopsy). Women who are ovulating may have additional tests. These may include:  Hysterosalpingography.  This is an X-ray of the fallopian tubes and uterus taken after a specific type of dye is injected.  This test can show the shape of the uterus and whether the fallopian tubes are open.  Laparoscopy.  In this test, a lighted tube (laparoscope) is used to look for problems in the fallopian tubes and other female organs.  Transvaginal ultrasound.  This is an imaging test to check for abnormalities of the uterus and ovaries.  A health care provider can use this test to count the number of follicles on the ovaries.  Hysteroscopy.  This test involves using a lighted tube to examine the cervix and inside the uterus.  It is done to find any abnormalities inside the uterus. What Tests Will Men Have? Tests for men's infertility includes:  Semen tests to check sperm count, morphology, and motility.  Blood tests to check for hormone levels.  Taking a small sample of tissue from inside a testicle (biopsy). This is examined under a microscope.  Blood tests to check for genetic abnormalities (genetic testing). HOW ARE WOMEN TREATED FOR INFERTILITY?  Treatment depends on the cause of infertility. Most cases of infertility in women are treated with medicine or surgery.  Women may take medicine to:  Correct ovulation  problems.  Treat other health conditions, such as PCOS.  Surgery may be done to:  Repair damage to the ovaries, fallopian tubes, cervix, or uterus.  Remove growths from the uterus.  Remove scar tissue from the uterus, pelvis, or other female organs. HOW ARE MEN TREATED FOR INFERTILITY?  Treatment depends on the cause of infertility. Most cases of infertility in men are treated with medicine or surgery.   Men may take medicine to:  Correct hormone problems.  Treat other health conditions.  Treat sexual dysfunction.  Surgery may be done to:  Remove blockages in the reproductive tract.  Correct other structural problems of the reproductive tract. WHAT IS ASSISTED REPRODUCTIVE TECHNOLOGY? Assisted reproductive technology (ART) refers to all treatments and procedures that combine eggs and sperm outside the body to try to help a couple conceive. ART is often combined with fertility drugs to stimulate ovulation. Sometimes ART is done using eggs retrieved from another woman's body (donor eggs) or from previously frozen fertilized eggs (embryos).  There are different types of ART. These include:   Intrauterine insemination (IUI).  In this procedure, sperm is placed directly into a woman's uterus with a long, thin tube.  This may be most effective for infertility caused by sperm problems, including low sperm count and low motility.  Can be used in combination with fertility drugs.  In vitro fertilization (IVF).  This is often done when a woman's fallopian tubes are blocked   or when a man has low sperm counts.  Fertility drugs stimulate the ovaries to produce multiple eggs. Once mature, these eggs are removed from the body and combined with the sperm to be fertilized.  These fertilized eggs are then placed in the woman's uterus.   This information is not intended to replace advice given to you by your health care provider. Make sure you discuss any questions you have with your  health care provider.   Document Released: 04/12/2003 Document Revised: 12/29/2014 Document Reviewed: 12/23/2013 Elsevier Interactive Patient Education 2016 [employer]  

## 2016-04-19 ENCOUNTER — Emergency Department (HOSPITAL_COMMUNITY)
Admission: EM | Admit: 2016-04-19 | Discharge: 2016-04-19 | Disposition: A | Discharge status: Home / Self Care | Payer: BLUE CROSS/BLUE SHIELD | Attending: Emergency Medicine | Admitting: Emergency Medicine

## 2016-04-19 ENCOUNTER — Encounter (HOSPITAL_COMMUNITY): Payer: Self-pay | Admitting: Emergency Medicine

## 2016-04-19 ENCOUNTER — Emergency Department (HOSPITAL_COMMUNITY): Payer: BLUE CROSS/BLUE SHIELD

## 2016-04-19 DIAGNOSIS — F17210 Nicotine dependence, cigarettes, uncomplicated: Secondary | ICD-10-CM | POA: Insufficient documentation

## 2016-04-19 DIAGNOSIS — R1030 Lower abdominal pain, unspecified: Secondary | ICD-10-CM

## 2016-04-19 DIAGNOSIS — N939 Abnormal uterine and vaginal bleeding, unspecified: Secondary | ICD-10-CM | POA: Diagnosis not present

## 2016-04-19 LAB — CBC
HEMATOCRIT: 41.2 % (ref 36.0–46.0)
HEMOGLOBIN: 13.7 g/dL (ref 12.0–15.0)
MCH: 28.6 pg (ref 26.0–34.0)
MCHC: 33.3 g/dL (ref 30.0–36.0)
MCV: 86 fL (ref 78.0–100.0)
Platelets: 198 10*3/uL (ref 150–400)
RBC: 4.79 MIL/uL (ref 3.87–5.11)
RDW: 14.2 % (ref 11.5–15.5)
WBC: 4.3 10*3/uL (ref 4.0–10.5)

## 2016-04-19 LAB — URINALYSIS, ROUTINE W REFLEX MICROSCOPIC
BACTERIA UA: NONE SEEN
Bilirubin Urine: NEGATIVE
Glucose, UA: NEGATIVE mg/dL
Ketones, ur: NEGATIVE mg/dL
Leukocytes, UA: NEGATIVE
Nitrite: NEGATIVE
PROTEIN: NEGATIVE mg/dL
SPECIFIC GRAVITY, URINE: 1.01 (ref 1.005–1.030)
pH: 8 (ref 5.0–8.0)

## 2016-04-19 LAB — COMPREHENSIVE METABOLIC PANEL
ALBUMIN: 4 g/dL (ref 3.5–5.0)
ALK PHOS: 52 U/L (ref 38–126)
ALT: 13 U/L — ABNORMAL LOW (ref 14–54)
ANION GAP: 8 (ref 5–15)
AST: 18 U/L (ref 15–41)
BUN: 12 mg/dL (ref 6–20)
CHLORIDE: 106 mmol/L (ref 101–111)
CO2: 25 mmol/L (ref 22–32)
Calcium: 8.6 mg/dL — ABNORMAL LOW (ref 8.9–10.3)
Creatinine, Ser: 0.71 mg/dL (ref 0.44–1.00)
GFR calc non Af Amer: >60 mL/min (ref >60)
GLUCOSE: 101 mg/dL — AB (ref 65–99)
POTASSIUM: 4.4 mmol/L (ref 3.5–5.1)
SODIUM: 139 mmol/L (ref 135–145)
Total Bilirubin: 0.8 mg/dL (ref 0.3–1.2)
Total Protein: 6.9 g/dL (ref 6.5–8.1)

## 2016-04-19 LAB — I-STAT BETA HCG BLOOD, ED (MC, WL, AP ONLY)

## 2016-04-19 LAB — WET PREP, GENITAL
Sperm: NONE SEEN
TRICH WET PREP: NONE SEEN
YEAST WET PREP: NONE SEEN

## 2016-04-19 LAB — LIPASE, BLOOD: LIPASE: 23 U/L (ref 11–51)

## 2016-04-19 MED ORDER — METRONIDAZOLE 500 MG PO TABS: 500.0000 mg | ORAL_TABLET | Freq: Two times a day (BID) | ORAL | 0 refills | Status: DC

## 2016-04-19 MED ORDER — METRONIDAZOLE 500 MG PO TABS
500.0000 mg | ORAL_TABLET | Freq: Once | ORAL | Status: AC
  Administered 2016-04-19: 500 mg via ORAL
  Filled 2016-04-19: qty 1

## 2016-04-19 NOTE — ED Notes (Signed)
Called pt for recheck and pt did not answer

## 2016-04-19 NOTE — Discharge Instructions (Signed)

## 2016-04-19 NOTE — ED Provider Notes (Signed)
Emergency Department Provider Note   I have reviewed the triage vital signs and the nursing notes.   HISTORY  Chief Complaint Vaginal Bleeding and Abdominal Pain   HPI Brittney Salazar is a 30 y.o. female with PMH of metrorrhagia presents to the emergency department for evaluation of vaginal bleeding and lower abdominal discomfort. Patient reports suddenly worsening discomfort on the right side. She describes it as intermittent with no exacerbating or alleviating factors. She denies vaginal discharge. No fevers or shaking chills. She reports prior history of PCOS and hemorrhagic cysts states this feels similar. She is not on oral contraceptive pills at this time and does not see a gynecologist.    Past Medical History:  Diagnosis Date  . Hx of metrorrhagia     Patient Active Problem List   Diagnosis Date Noted  . 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 Polycystic ovarian syndrome 08/07/2011  . Elevated testosterone level in female 08/07/2011  . Herpes simplex 08/07/2011  . Vitamin D deficiency 08/07/2011  . Hirsutism 08/07/2011    Past Surgical History:  Procedure Laterality Date  . WISDOM TOOTH EXTRACTION      Current Outpatient Rx  . Order #: [id number] Class: Historical Med  . Order #: [id number] Class: Print  . Order #: [id number] Class: Print  . Order #: [id number] Class: Print    Allergies Ciprofloxacin  Family History  Problem Relation Age of Onset  . Hypertension Mother   . Arthritis Father     Social History Social History  Substance Use Topics  . Smoking status: Current Every Day Smoker    Packs/day: 0.25    Types: Cigarettes  . Smokeless tobacco: Never Used  . Alcohol use No    Review of Systems  Constitutional: No fever/chills Eyes: No visual changes. ENT: No sore throat. Cardiovascular: Denies chest pain. Respiratory: Denies shortness of breath. Gastrointestinal: Positive lower abdominal pain.  No nausea, no vomiting.  No  diarrhea.  No constipation. Genitourinary: Negative for dysuria. Positive vaginal bleeding.  Musculoskeletal: Negative for back pain. Skin: Negative for rash. Neurological: Negative for headaches, focal weakness or numbness.  10-point ROS otherwise negative.  ____________________________________________   PHYSICAL EXAM:  VITAL SIGNS: ED Triage Vitals  Enc Vitals Group     BP 04/19/16 0946 121/74     Pulse Rate 04/19/16 0946 89     Resp 04/19/16 0946 18     Temp 04/19/16 0946 98.4 F (36.9 C)     Temp Source 04/19/16 0946 Oral     SpO2 04/19/16 0946 99 %     Weight 04/19/16 0951 242 lb (109.8 kg)     Height 04/19/16 0951 5\' 4"  (1.626 m)     Pain Score 04/19/16 0951 4   Constitutional: Alert and oriented. Well appearing and in no acute distress. Eyes: Conjunctivae are normal. Head: Atraumatic. Nose: No congestion/rhinnorhea. Mouth/Throat: Mucous membranes are moist.  Oropharynx non-erythematous. Neck: No stridor.  Cardiovascular: Normal rate, regular rhythm. Good peripheral circulation. Grossly normal heart sounds.   Respiratory: Normal respiratory effort.  No retractions. Lungs CTAB. Gastrointestinal: Soft and nontender. No distention.  Genitourinary: Mild vaginal bleeding. No discharge. No CMT or adnexal tenderness or fullness.  Musculoskeletal: No lower extremity tenderness nor edema. No gross deformities of extremities. Neurologic:  Normal speech and language. No gross focal neurologic deficits are appreciated.  Skin:  Skin is warm, dry and intact. No rash noted.   ____________________________________________   LABS (all labs ordered are listed, but only abnormal results are displayed)  Labs  Reviewed  WET PREP, GENITAL - Abnormal; Notable for the following:       Result Value   Clue Cells Wet Prep HPF POC PRESENT (*)    WBC, Wet Prep HPF POC FEW (*)    All other components within normal limits  COMPREHENSIVE METABOLIC PANEL - Abnormal; Notable for the following:     Glucose, Bld 101 (*)    Calcium 8.6 (*)    ALT 13 (*)    All other components within normal limits  URINALYSIS, ROUTINE W REFLEX MICROSCOPIC - Abnormal; Notable for the following:    Color, Urine STRAW (*)    Hgb urine dipstick MODERATE (*)    Squamous Epithelial / LPF 0-5 (*)    All other components within normal limits  LIPASE, BLOOD  CBC  I-STAT BETA HCG BLOOD, ED ([facility], [facility], [facility] ONLY)  GC/CHLAMYDIA PROBE AMP ([facility] NOT [facility]   ____________________________________________  RADIOLOGY  [place] Transvaginal Non-ob  Result Date: 04/19/2016 CLINICAL DATA:  Pelvic pain for several days. LMP 03/29/2016. No previous relevant surgery. EXAM: TRANSABDOMINAL AND TRANSVAGINAL ULTRASOUND OF PELVIS DOPPLER ULTRASOUND OF OVARIES TECHNIQUE: Both transabdominal and transvaginal ultrasound examinations of the pelvis were performed. Transabdominal technique was performed for global imaging of the pelvis including uterus, ovaries, adnexal regions, and pelvic cul-de-sac. It was necessary to proceed with endovaginal exam following the transabdominal exam to visualize the endometrium and ovaries to better advantage. Color and duplex Doppler ultrasound was utilized to evaluate blood flow to the ovaries. COMPARISON:  Pelvic ultrasound 10/29/2015 FINDINGS: Uterus Measurements: 8.0 x 3.1 x 3.7 cm. No fibroids or other mass visualized. Endometrium Thickness: 4.2 mm.  No focal abnormality visualized. Right ovary Measurements: 3.9 x 2.6 x 3.0 cm. Normal appearance/no adnexal mass. Normal blood flow with color Doppler. Left ovary Measurements: 3.3 x 2.4 x 2.9 cm. Normal appearance/no adnexal mass. Normal blood flow with color Doppler. Pulsed Doppler evaluation of both ovaries demonstrates normal low-resistance arterial and venous waveforms. Other findings No abnormal free fluid. IMPRESSION: Normal pelvic ultrasound. No evidence of adnexal mass or ovarian torsion. Electronically Signed   By: Carey BullocksWilliam  Veazey M.D.   On:  04/19/2016 14:02   [place] Pelvis Complete  Result Date: 04/19/2016 CLINICAL DATA:  Pelvic pain for several days. LMP 03/29/2016. No previous relevant surgery. EXAM: TRANSABDOMINAL AND TRANSVAGINAL ULTRASOUND OF PELVIS DOPPLER ULTRASOUND OF OVARIES TECHNIQUE: Both transabdominal and transvaginal ultrasound examinations of the pelvis were performed. Transabdominal technique was performed for global imaging of the pelvis including uterus, ovaries, adnexal regions, and pelvic cul-de-sac. It was necessary to proceed with endovaginal exam following the transabdominal exam to visualize the endometrium and ovaries to better advantage. Color and duplex Doppler ultrasound was utilized to evaluate blood flow to the ovaries. COMPARISON:  Pelvic ultrasound 10/29/2015 FINDINGS: Uterus Measurements: 8.0 x 3.1 x 3.7 cm. No fibroids or other mass visualized. Endometrium Thickness: 4.2 mm.  No focal abnormality visualized. Right ovary Measurements: 3.9 x 2.6 x 3.0 cm. Normal appearance/no adnexal mass. Normal blood flow with color Doppler. Left ovary Measurements: 3.3 x 2.4 x 2.9 cm. Normal appearance/no adnexal mass. Normal blood flow with color Doppler. Pulsed Doppler evaluation of both ovaries demonstrates normal low-resistance arterial and venous waveforms. Other findings No abnormal free fluid. IMPRESSION: Normal pelvic ultrasound. No evidence of adnexal mass or ovarian torsion. Electronically Signed   By: Carey BullocksWilliam  Veazey M.D.   On: 04/19/2016 14:02   [place] Art/ven Flow Abd Pelv Doppler  Result Date: 04/19/2016 CLINICAL DATA:  Pelvic pain  for several days. LMP 03/29/2016. No previous relevant surgery. EXAM: TRANSABDOMINAL AND TRANSVAGINAL ULTRASOUND OF PELVIS DOPPLER ULTRASOUND OF OVARIES TECHNIQUE: Both transabdominal and transvaginal ultrasound examinations of the pelvis were performed. Transabdominal technique was performed for global imaging of the pelvis including uterus, ovaries, adnexal regions, and pelvic cul-de-sac.  It was necessary to proceed with endovaginal exam following the transabdominal exam to visualize the endometrium and ovaries to better advantage. Color and duplex Doppler ultrasound was utilized to evaluate blood flow to the ovaries. COMPARISON:  Pelvic ultrasound 10/29/2015 FINDINGS: Uterus Measurements: 8.0 x 3.1 x 3.7 cm. No fibroids or other mass visualized. Endometrium Thickness: 4.2 mm.  No focal abnormality visualized. Right ovary Measurements: 3.9 x 2.6 x 3.0 cm. Normal appearance/no adnexal mass. Normal blood flow with color Doppler. Left ovary Measurements: 3.3 x 2.4 x 2.9 cm. Normal appearance/no adnexal mass. Normal blood flow with color Doppler. Pulsed Doppler evaluation of both ovaries demonstrates normal low-resistance arterial and venous waveforms. Other findings No abnormal free fluid. IMPRESSION: Normal pelvic ultrasound. No evidence of adnexal mass or ovarian torsion. Electronically Signed   By: Carey BullocksWilliam  Veazey M.D.   On: 04/19/2016 14:02    ____________________________________________   PROCEDURES  Procedure(s) performed:   Procedures   ____________________________________________   INITIAL IMPRESSION / ASSESSMENT AND PLAN / ED COURSE  Pertinent labs & imaging results that were available during my care of the patient were reviewed by me and considered in my medical decision making (see chart for details).  Patient with very mild amount of vaginal bleeding. She has no focal adnexal tenderness. No cervical motion tenderness. Given the intermittent nature of her symptoms and history of untreated piece US of plan for evaluation of ovarian torsion although suspect that the ovarian cyst is more likely.   US negative. Pregnancy negative. Plan for discharge with treatment for BV and Gyn/PCP follow up.   At this time, I do not feel there is any life-threatening condition present. I have reviewed and discussed all results (EKG, imaging, lab, urine as appropriate), exam findings  with patient. I have reviewed nursing notes and appropriate previous records.  I feel the patient is safe to be discharged home without further emergent workup. Discussed usual and customary return precautions. Patient and family (if present) verbalize understanding and are comfortable with this plan.  Patient will follow-up with their primary care provider. If they do not have a primary care provider, information for follow-up has been provided to them. All questions have been answered.  ____________________________________________  FINAL CLINICAL IMPRESSION(S) / ED DIAGNOSES  Final diagnoses:  Vaginal bleeding  Lower abdominal pain     MEDICATIONS GIVEN DURING THIS VISIT:  Medications  metroNIDAZOLE (FLAGYL) tablet 500 mg (500 mg Oral Given 04/19/16 1511)    NEW OUTPATIENT MEDICATIONS STARTED DURING THIS VISIT:  Discharge Medication List as of 04/19/2016  3:27 PM    START taking these medications   Details  metroNIDAZOLE (FLAGYL) 500 MG tablet Take 1 tablet (500 mg total) by mouth 2 (two) times daily., Starting Thu 04/19/2016, Print         Note:  This document was prepared using Dragon voice recognition software and may include unintentional dictation errors.  Alona BeneJoshua Long, MD Emergency Medicine   Maia PlanJoshua G Long, MD 04/19/16 [phone]

## 2016-04-19 NOTE — ED Triage Notes (Addendum)
Patient reports vaginal bleeding 2-3 days. Patient states this happened in July and was dx with a hemorrhaging cyst. Denies nausea, vomiting, diarrhea.

## 2016-04-19 NOTE — ED Notes (Signed)
Bed: WA16 Expected date:  Expected time:  Means of arrival:  Comments: Triage 1 

## 2016-04-20 LAB — GC/CHLAMYDIA PROBE AMP (~~LOC~~) NOT AT ARMC
CHLAMYDIA, DNA PROBE: NEGATIVE
NEISSERIA GONORRHEA: NEGATIVE

## 2016-08-24 ENCOUNTER — Emergency Department (HOSPITAL_COMMUNITY)
Admission: EM | Admit: 2016-08-24 | Discharge: 2016-08-24 | Disposition: A | Discharge status: Home / Self Care | Payer: BLUE CROSS/BLUE SHIELD | Attending: Emergency Medicine | Admitting: Emergency Medicine

## 2016-08-24 ENCOUNTER — Encounter (HOSPITAL_COMMUNITY): Payer: Self-pay | Admitting: Emergency Medicine

## 2016-08-24 ENCOUNTER — Emergency Department (HOSPITAL_COMMUNITY): Payer: BLUE CROSS/BLUE SHIELD

## 2016-08-24 DIAGNOSIS — J40 Bronchitis, not specified as acute or chronic: Secondary | ICD-10-CM

## 2016-08-24 DIAGNOSIS — R0781 Pleurodynia: Secondary | ICD-10-CM | POA: Diagnosis present

## 2016-08-24 DIAGNOSIS — F17210 Nicotine dependence, cigarettes, uncomplicated: Secondary | ICD-10-CM | POA: Diagnosis not present

## 2016-08-24 DIAGNOSIS — Z7982 Long term (current) use of aspirin: Secondary | ICD-10-CM | POA: Diagnosis not present

## 2016-08-24 LAB — CBC
HEMATOCRIT: 40.6 % (ref 36.0–46.0)
Hemoglobin: 13.8 g/dL (ref 12.0–15.0)
MCH: 29 pg (ref 26.0–34.0)
MCHC: 34 g/dL (ref 30.0–36.0)
MCV: 85.3 fL (ref 78.0–100.0)
Platelets: 196 10*3/uL (ref 150–400)
RBC: 4.76 MIL/uL (ref 3.87–5.11)
RDW: 13.9 % (ref 11.5–15.5)
WBC: 4.8 10*3/uL (ref 4.0–10.5)

## 2016-08-24 LAB — BASIC METABOLIC PANEL
ANION GAP: 8 (ref 5–15)
BUN: 9 mg/dL (ref 6–20)
CALCIUM: 8.9 mg/dL (ref 8.9–10.3)
CO2: 22 mmol/L (ref 22–32)
Chloride: 109 mmol/L (ref 101–111)
Creatinine, Ser: 0.94 mg/dL (ref 0.44–1.00)
GFR calc Af Amer: >60 mL/min (ref >60)
Glucose, Bld: 108 mg/dL — ABNORMAL HIGH (ref 65–99)
POTASSIUM: 3.1 mmol/L — AB (ref 3.5–5.1)
SODIUM: 139 mmol/L (ref 135–145)

## 2016-08-24 LAB — I-STAT TROPONIN, ED: TROPONIN I, POC: 0 ng/mL (ref 0.00–0.08)

## 2016-08-24 MED ORDER — ALBUTEROL SULFATE (2.5 MG/3ML) 0.083% IN NEBU
5.0000 mg | INHALATION_SOLUTION | Freq: Once | RESPIRATORY_TRACT | Status: AC
  Administered 2016-08-24: 5 mg via RESPIRATORY_TRACT
  Filled 2016-08-24: qty 6

## 2016-08-24 MED ORDER — IPRATROPIUM BROMIDE 0.02 % IN SOLN
0.5000 mg | Freq: Once | RESPIRATORY_TRACT | Status: AC
  Administered 2016-08-24: 0.5 mg via RESPIRATORY_TRACT
  Filled 2016-08-24: qty 2.5

## 2016-08-24 MED ORDER — PREDNISONE 20 MG PO TABS: 40.0000 mg | ORAL_TABLET | Freq: Every day | ORAL | 0 refills | Status: DC

## 2016-08-24 MED ORDER — ALBUTEROL SULFATE HFA 108 (90 BASE) MCG/ACT IN AERS
2.0000 | INHALATION_SPRAY | RESPIRATORY_TRACT | Status: DC | PRN
  Administered 2016-08-24: 2 via RESPIRATORY_TRACT
  Filled 2016-08-24: qty 6.7

## 2016-08-24 MED ORDER — PREDNISONE 20 MG PO TABS
60.0000 mg | ORAL_TABLET | Freq: Once | ORAL | Status: AC
  Administered 2016-08-24: 60 mg via ORAL
  Filled 2016-08-24: qty 3

## 2016-08-24 MED ORDER — HYDROCODONE-HOMATROPINE 5-1.5 MG/5ML PO SYRP
5.0000 mL | ORAL_SOLUTION | Freq: Four times a day (QID) | ORAL | 0 refills | Status: DC | PRN
Start: 2016-08-24 — End: 2018-12-09

## 2016-08-24 NOTE — ED Triage Notes (Signed)
Per pt, states chest pain radiating to back-states it has been going on since Wednesday-no other symptoms

## 2016-08-24 NOTE — ED Provider Notes (Signed)
[facility]-EMERGENCY DEPT Provider Note   CSN: [id number] Arrival date & time: 08/24/16  6295     History   Chief Complaint Chief Complaint  Patient presents with  . Chest Pain    HPI Brittney Salazar is a 31 y.o. female.  Patient is a 31 year old female with no significant past medical history presenting today with 3 days of worsening cough, shortness of breath, sharp pleuritic type chest pains and intermittent posttussive emesis. She has had chills but denies documented fever. Abdominal pain only with coughing but no persistent abdominal pain or diarrhea. Works at [employer] so has had multiple sick contacts. She does smoke but denies a history of asthma or COPD. Over the last 2 days she is also noted wheezing.   The history is provided by the patient.  Chest Pain      Past Medical History:  Diagnosis Date  . Hx of metrorrhagia     Patient Active Problem List   Diagnosis Date Noted  . 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 Polycystic ovarian syndrome 08/07/2011  . Elevated testosterone level in female 08/07/2011  . Herpes simplex 08/07/2011  . Vitamin D deficiency 08/07/2011  . Hirsutism 08/07/2011    Past Surgical History:  Procedure Laterality Date  . WISDOM TOOTH EXTRACTION      OB History    Gravida Para Term Preterm AB Living   0 0 0 0 0 0   SAB TAB Ectopic Multiple Live Births   0 0 0 0         Home Medications    Prior to Admission medications   Medication Sig Start Date End Date Taking? Authorizing Provider  aspirin-acetaminophen-caffeine (EXCEDRIN MIGRAINE) [phone] MG tablet Take 1 tablet by mouth every 6 (six) hours as needed for headache.    Historical Provider, MD  doxycycline (VIBRAMYCIN) 100 MG capsule Take 1 capsule (100 mg total) by mouth 2 (two) times daily. Patient not taking: Reported on 04/19/2016 10/29/15   Fayrene Helper, PA-C  ibuprofen (ADVIL,MOTRIN) 800 MG tablet Take 1 tablet (800 mg total) by mouth every 8 (eight) hours as  needed for moderate pain. Patient not taking: Reported on 04/19/2016 10/29/15   Fayrene Helper, PA-C  metroNIDAZOLE (FLAGYL) 500 MG tablet Take 1 tablet (500 mg total) by mouth 2 (two) times daily. 04/19/16   Maia Plan, MD    Family History Family History  Problem Relation Age of Onset  . Hypertension Mother   . Arthritis Father     Social History Social History  Substance Use Topics  . Smoking status: Current Every Day Smoker    Packs/day: 0.25    Types: Cigarettes  . Smokeless tobacco: Never Used  . Alcohol use No     Allergies   Ciprofloxacin   Review of Systems Review of Systems  Cardiovascular: Positive for chest pain.  All other systems reviewed and are negative.    Physical Exam Updated Vital Signs BP (!) 104/55   Pulse 85   Temp 98 F (36.7 C) (Oral)   Resp 13   Ht 5\' 6"  (1.676 m)   Wt 240 lb (108.9 kg)   LMP 08/10/2016   SpO2 98%   BMI 38.74 kg/m   Physical Exam  Constitutional: She is oriented to person, place, and time. She appears well-developed and well-nourished. No distress.  HENT:  Head: Normocephalic and atraumatic.  Right Ear: Tympanic membrane normal.  Left Ear: Tympanic membrane normal.  Nose: Mucosal edema present.  Mouth/Throat: Oropharynx is clear  and moist.  Eyes: Conjunctivae and EOM are normal. Pupils are equal, round, and reactive to light.  Neck: Normal range of motion. Neck supple.  Cardiovascular: Normal rate, regular rhythm and intact distal pulses.   No murmur heard. Pulmonary/Chest: Effort normal. No respiratory distress. She has wheezes. She has no rales.  Abdominal: Soft. She exhibits no distension. There is no tenderness. There is no rebound and no guarding.  Musculoskeletal: Normal range of motion. She exhibits no edema or tenderness.  Neurological: She is alert and oriented to person, place, and time.  Skin: Skin is warm and dry. No rash noted. No erythema.  Psychiatric: She has a normal mood and affect. Her behavior  is normal.  Nursing note and vitals reviewed.    ED Treatments / Results  Labs (all labs ordered are listed, but only abnormal results are displayed) Labs Reviewed  BASIC METABOLIC PANEL - Abnormal; Notable for the following:       Result Value   Potassium 3.1 (*)    Glucose, Bld 108 (*)    All other components within normal limits  CBC  I-STAT TROPOININ, ED    EKG  EKG Interpretation  Date/Time:  Friday Aug 24 2016 10:12:09 EDT Ventricular Rate:  69 PR Interval:    QRS Duration: 92 QT Interval:  424 QTC Calculation: 455 R Axis:   63 Text Interpretation:  Sinus rhythm RSR' in V1 or V2, probably normal variant No previous tracing Confirmed by Anitra LauthPLUNKETT  MD, Jerimyah Vandunk ([id number]) on 08/24/2016 12:36:39 PM       Radiology Dg Chest 2 View  Result Date: 08/24/2016 CLINICAL DATA:  31 year old female with chest pain radiating to the back 2 days. Initial encounter. Smoker. EXAM: CHEST  2 VIEW COMPARISON:  12/19/2007. FINDINGS: Normal lung volumes. Normal cardiac size and mediastinal contours. Visualized tracheal air column is within normal limits. The lungs are clear. No pneumothorax or pleural effusion. Negative visible bowel gas pattern. No osseous abnormality identified. IMPRESSION: Negative.  No acute cardiopulmonary abnormality. Electronically Signed   By: Odessa FlemingH  Hall M.D.   On: 08/24/2016 11:30    Procedures Procedures (including critical care time)  Medications Ordered in ED Medications  predniSONE (DELTASONE) tablet 60 mg (not administered)  albuterol (PROVENTIL) (2.5 MG/3ML) 0.083% nebulizer solution 5 mg (not administered)  ipratropium (ATROVENT) nebulizer solution 0.5 mg (not administered)     Initial Impression / Assessment and Plan / ED Course  I have reviewed the triage vital signs and the nursing notes.  Pertinent labs & imaging results that were available during my care of the patient were reviewed by me and considered in my medical decision making (see chart for  details).     Pt with symptoms consistent with viral bronchitis.  Well appearing here.  But wheezing and coughing. No signs of pharyngitis, otitis or abnormal abdominal findings.   CXR wnl and labs with mild hypokalemia but o/w wnl.  Patient given albuterol, Atrovent and prednisone. Will recheck. 2:34 PM Wheezing much improved and pt d/ced home. pt to return with any further problems.  Final Clinical Impressions(s) / ED Diagnoses   Final diagnoses:  Bronchitis    New Prescriptions New Prescriptions   HYDROCODONE-HOMATROPINE (HYCODAN) 5-1.5 MG/5ML SYRUP    Take 5 mLs by mouth every 6 (six) hours as needed for cough.   PREDNISONE (DELTASONE) 20 MG TABLET    Take 2 tablets (40 mg total) by mouth daily.     Gwyneth SproutWhitney Amoreena Neubert, MD 08/24/16 1434

## 2016-08-24 NOTE — ED Notes (Signed)
Discharge instructions, follow up care, and rx x2 reviewed with patient. Patient verbalized understanding. 

## 2018-12-09 ENCOUNTER — Ambulatory Visit
Admission: EM | Admit: 2018-12-09 | Discharge: 2018-12-09 | Disposition: A | Discharge status: Home / Self Care | Payer: 59

## 2018-12-09 ENCOUNTER — Other Ambulatory Visit: Payer: Self-pay

## 2018-12-09 DIAGNOSIS — R05 Cough: Secondary | ICD-10-CM

## 2018-12-09 DIAGNOSIS — R059 Cough, unspecified: Secondary | ICD-10-CM

## 2018-12-09 DIAGNOSIS — J029 Acute pharyngitis, unspecified: Secondary | ICD-10-CM

## 2018-12-09 DIAGNOSIS — R062 Wheezing: Secondary | ICD-10-CM

## 2018-12-09 MED ORDER — ALBUTEROL SULFATE HFA 108 (90 BASE) MCG/ACT IN AERS: 1.0000 | INHALATION_SPRAY | Freq: Four times a day (QID) | RESPIRATORY_TRACT | 0 refills | Status: DC | PRN

## 2018-12-09 MED ORDER — BENZONATATE 200 MG PO CAPS: 200.0000 mg | ORAL_CAPSULE | Freq: Three times a day (TID) | ORAL | 0 refills | Status: DC

## 2018-12-09 NOTE — ED Provider Notes (Signed)
[name]    CSN: [id number] Arrival date & time: 12/09/18  1015     History   Chief Complaint Chief Complaint  Patient presents with  . Sore Throat    HPI Brittney Salazar is a 33 y.o. female.   33 year old female comes in for 2-day history of cough, rhinorrhea, nasal congestion, sneezing, wheezing.  Also complaining of chills without any fevers, body aches.  4 days ago had episode of abdominal pain with nausea that has since resolved.  Currently denies abdominal pain, nausea, vomiting, diarrhea.  Denies loss of taste or smell.  She denies shortness of breath.  She is working from home, no travels.  No sick contact.  No COVID contact.  Denies any antipyretics in the last 8 hours.  She is a current everyday smoker, with history of bronchitis requiring inhaler use.     Past Medical History:  Diagnosis Date  . Hx of metrorrhagia     Patient Active Problem List   Diagnosis Date Noted  . 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 Polycystic ovarian syndrome 08/07/2011  . Elevated testosterone level in female 08/07/2011  . Herpes simplex 08/07/2011  . Vitamin D deficiency 08/07/2011  . Hirsutism 08/07/2011    Past Surgical History:  Procedure Laterality Date  . WISDOM TOOTH EXTRACTION      OB History    Gravida  0   Para  0   Term  0   Preterm  0   AB  0   Living  0     SAB  0   TAB  0   Ectopic  0   Multiple  0   Live Births               Home Medications    Prior to Admission medications   Medication Sig Start Date End Date Taking? Authorizing Provider  traZODone (DESYREL) 50 MG tablet Take 50 mg by mouth at bedtime.   Yes [provider]  albuterol (VENTOLIN HFA) 108 (90 Base) MCG/ACT inhaler Inhale 1-2 puffs into the lungs every 6 (six) hours as needed for wheezing or shortness of breath. 12/09/18   Cathie HoopsYu, Amy V, PA-C  benzonatate (TESSALON) 200 MG capsule Take 1 capsule (200 mg total) by mouth every 8  (eight) hours. 12/09/18   Belinda FisherYu, Amy V, PA-C    Family History Family History  Problem Relation Age of Onset  . Hypertension Mother   . Arthritis Father     Social History Social History   Tobacco Use  . Smoking status: Current Every Day Smoker    Packs/day: 0.25    Types: Cigarettes  . Smokeless tobacco: Never Used  Substance Use Topics  . Alcohol use: No  . Drug use: No     Allergies   Ciprofloxacin   Review of Systems Review of Systems  Reason unable to perform ROS: See HPI as above.     Physical Exam Triage Vital Signs ED Triage Vitals [12/09/18 1026]  Enc Vitals Group     BP 138/80     Pulse Rate 88     Resp 18     Temp 98.1 F (36.7 C)     Temp Source Oral     SpO2 99 %     Weight      Height      Head Circumference      Peak Flow      Pain Score 0     Pain Loc  Pain Edu?      Excl. in [place]?    No data found.  Updated Vital Signs BP 138/80 (BP Location: Left Arm)   Pulse 88   Temp 98.1 F (36.7 C) (Oral)   Resp 18   LMP 11/12/2018   SpO2 99%   Physical Exam Constitutional:      General: She is not in acute distress.    Appearance: Normal appearance. She is not ill-appearing, toxic-appearing or diaphoretic.  HENT:     Head: Normocephalic and atraumatic.     Mouth/Throat:     Mouth: Mucous membranes are moist.     Pharynx: Oropharynx is clear. Uvula midline.     Tonsils: No tonsillar exudate.  Neck:     Musculoskeletal: Normal range of motion and neck supple.  Cardiovascular:     Rate and Rhythm: Normal rate and regular rhythm.     Heart sounds: Normal heart sounds. No murmur. No friction rub. No gallop.   Pulmonary:     Effort: Pulmonary effort is normal. No accessory muscle usage, prolonged expiration, respiratory distress or retractions.     Comments: Speaking in full sentences without difficulty.  Lungs clear to auscultation without adventitious lung sounds. Skin:    General: Skin is warm and dry.  Neurological:     General:  No focal deficit present.     Mental Status: She is alert and oriented to person, place, and time.      UC Treatments / Results  Labs (all labs ordered are listed, but only abnormal results are displayed) Labs Reviewed  NOVEL CORONAVIRUS, NAA    EKG   Radiology No results found.  Procedures Procedures (including critical care time)  Medications Ordered in UC Medications - No data to display  Initial Impression / Assessment and Plan / UC Course  I have reviewed the triage vital signs and the nursing notes.  Pertinent labs & imaging results that were available during my care of the patient were reviewed by me and considered in my medical decision making (see chart for details).    Patient speaking in full sentences without respiratory distress. Afebrile without antipyretic in last 8 hours. No tachycardia, tachypnea. Lungs CTAB. COVID testing ordered. Given history and exam, will have patient self quarantine until results. Instructions on when to end quarantine, family member isolation discussed, and resources provided. Symptomatic treatment discussed. Return precautions given. Patient expresses understanding and agrees to plan.  Final Clinical Impressions(s) / UC Diagnoses   Final diagnoses:  Cough  Wheezing  Sore throat    ED Prescriptions    Medication Sig Dispense Auth. Provider   albuterol (VENTOLIN HFA) 108 (90 Base) MCG/ACT inhaler Inhale 1-2 puffs into the lungs every 6 (six) hours as needed for wheezing or shortness of breath. 8 g Yu, Amy V, PA-C   benzonatate (TESSALON) 200 MG capsule Take 1 capsule (200 mg total) by mouth every 8 (eight) hours. 21 capsule Tobin Chad, [place] 12/09/18 1052

## 2018-12-09 NOTE — Discharge Instructions (Addendum)
As discussed, cannot rule out COVID. Currently, no alarming signs. Testing ordered. I would like you to quarantine until testing results. Tessalon for cough. Albuterol as needed for wheezing/shortness of breath. If experiencing shortness of breath, trouble breathing, call 911 and provide them with your current situation.   For sore throat/cough try using a honey-based tea. Use 3 teaspoons of honey with juice squeezed from half lemon. Place shaved pieces of ginger into 1/2-1 cup of water and warm over stove top. Then mix the ingredients and repeat every 4 hours as needed.

## 2018-12-09 NOTE — ED Triage Notes (Signed)
Pt states woke up yesterday with wheezing and sore throat, now having a dry cough. Hx of bronchitis

## 2018-12-10 LAB — NOVEL CORONAVIRUS, NAA: SARS-CoV-2, NAA: NOT DETECTED

## 2018-12-11 ENCOUNTER — Encounter (HOSPITAL_COMMUNITY): Payer: Self-pay

## 2019-08-09 DIAGNOSIS — Z6841 Body Mass Index (BMI) 40.0 and over, adult: Secondary | ICD-10-CM | POA: Insufficient documentation

## 2020-04-09 ENCOUNTER — Ambulatory Visit: Payer: Self-pay | Attending: Internal Medicine

## 2020-04-09 ENCOUNTER — Ambulatory Visit: Payer: Self-pay

## 2020-04-09 DIAGNOSIS — Z23 Encounter for immunization: Secondary | ICD-10-CM

## 2020-04-09 NOTE — Progress Notes (Signed)
   Covid-19 Vaccination Clinic  Name:  Brittney Salazar    MRN: [id number] DOB: 12-13-1985  04/09/2020  [name] was observed post Covid-19 immunization for 15 minutes without incident. She was provided with Vaccine Information Sheet and instruction to access the V-Safe system.   [name] was instructed to call 911 with any severe reactions post vaccine: Marland Kitchen Difficulty breathing  . Swelling of face and throat  . A fast heartbeat  . A bad rash all over body  . Dizziness and weakness   Immunizations Administered    Name Date Dose VIS Date Route   Pfizer COVID-19 Vaccine 04/09/2020 11:06 AM 0.3 mL 02/10/2020 Intranasal   Manufacturer: [employer], [employer]   Lot: [id number]   NDC: [address]-2

## 2020-05-18 DIAGNOSIS — O00112 Left tubal pregnancy with intrauterine pregnancy: Secondary | ICD-10-CM | POA: Insufficient documentation

## 2020-05-18 HISTORY — DX: Left tubal pregnancy with intrauterine pregnancy: O00.112

## 2020-05-25 ENCOUNTER — Encounter: Payer: Self-pay | Admitting: Radiology

## 2020-05-26 ENCOUNTER — Encounter: Payer: Self-pay | Admitting: Radiology

## 2020-06-15 ENCOUNTER — Other Ambulatory Visit (HOSPITAL_COMMUNITY)
Admission: RE | Admit: 2020-06-15 | Discharge: 2020-06-15 | Disposition: A | Discharge status: Home / Self Care | Payer: 59 | Source: Ambulatory Visit | Attending: Family Medicine | Admitting: Family Medicine

## 2020-06-15 ENCOUNTER — Other Ambulatory Visit: Payer: Self-pay

## 2020-06-15 ENCOUNTER — Other Ambulatory Visit: Payer: Self-pay | Admitting: Family Medicine

## 2020-06-15 ENCOUNTER — Encounter: Payer: Self-pay | Admitting: Family Medicine

## 2020-06-15 ENCOUNTER — Ambulatory Visit (INDEPENDENT_AMBULATORY_CARE_PROVIDER_SITE_OTHER): Payer: 59 | Admitting: Family Medicine

## 2020-06-15 VITALS — BP 121/78 | HR 80 | Wt 240.0 lb

## 2020-06-15 DIAGNOSIS — O00112 Left tubal pregnancy with intrauterine pregnancy: Secondary | ICD-10-CM

## 2020-06-15 DIAGNOSIS — E282 Polycystic ovarian syndrome: Secondary | ICD-10-CM

## 2020-06-15 DIAGNOSIS — O99210 Obesity complicating pregnancy, unspecified trimester: Secondary | ICD-10-CM

## 2020-06-15 DIAGNOSIS — O0990 Supervision of high risk pregnancy, unspecified, unspecified trimester: Secondary | ICD-10-CM | POA: Diagnosis present

## 2020-06-15 DIAGNOSIS — E8881 Metabolic syndrome: Secondary | ICD-10-CM

## 2020-06-15 DIAGNOSIS — Z8639 Personal history of other endocrine, nutritional and metabolic disease: Secondary | ICD-10-CM

## 2020-06-15 MED ORDER — ASPIRIN EC 81 MG PO TBEC
81.0000 mg | DELAYED_RELEASE_TABLET | Freq: Every day | ORAL | 2 refills | Status: DC
Start: 2020-06-15 — End: 2020-12-30

## 2020-06-15 NOTE — Progress Notes (Signed)
INITIAL PRENATAL VISIT  Subjective:   Brittney Salazar is being seen today for her first obstetrical visit.  This is a planned pregnancy. This is a desired pregnancy.  She is at [redacted]w[redacted]d gestation by IUI   Her obstetrical history is significant for advanced maternal age, obesity and pregnancy achieved with REI (letroazole, levothyroxine, metoformin, IUIx 4 cycles). Relationship with FOB: spouse, living together. Patient does intend to breast feed. Pregnancy history fully reviewed.  Patient reports no complaints.  Indications for ASA therapy (per uptodate) One of the following: Previous pregnancy with preeclampsia, especially early onset and with an adverse outcome No Multifetal gestation No Chronic hypertension No Type 1 or 2 diabetes mellitus No Chronic kidney disease No Autoimmune disease (antiphospholipid syndrome, systemic lupus erythematosus) No  Two or more of the following: Nulliparity Yes Obesity (body mass index >30 kg/m2) Yes Family history of preeclampsia in mother or sister No Age ?35 years Yes Sociodemographic characteristics (African American race, low socioeconomic level) Yes Personal risk factors (eg, previous pregnancy with low birth weight or small for gestational age infant, previous adverse pregnancy outcome [eg, stillbirth], interval >10 years between pregnancies) No  Indications for early GDM screening  First-degree relative with diabetes No BMI >30kg/m2 Yes Age > 25 Yes Previous birth of an infant weighing ?4000 g No Gestational diabetes mellitus in a previous pregnancy No Glycated hemoglobin ?5.7 percent (39 mmol/mol), impaired glucose tolerance, or impaired fasting glucose on previous testing No High-risk race/ethnicity (eg, African American, Latino, Native American, Asian American, Pacific Islander) Yes Previous stillbirth of unknown cause No Maternal birthweight > 9 lbs No History of cardiovascular disease No Hypertension or on therapy for hypertension  No High-density lipoprotein cholesterol level <35 mg/dL (1.06 mmol/L) and/or a triglyceride level >250 mg/dL (2.69 mmol/L) No Polycystic ovary syndrome Yes Physical inactivity No Other clinical condition associated with insulin resistance (eg, severe obesity, acanthosis nigricans) Yes Current use of glucocorticoids No   Early screening tests: Has A1C in August 2021-- 5.2%   Review of Systems:   Review of Systems  Objective:    Obstetric History OB History  Gravida Para Term Preterm AB Living  1 0 0 0 0 0  SAB IAB Ectopic Multiple Live Births  0 0 0 0      # Outcome Date GA Lbr Len/2nd Weight Sex Delivery Anes PTL Lv  1 Current             Past Medical History:  Diagnosis Date  . Hx of metrorrhagia     Past Surgical History:  Procedure Laterality Date  . SALPINGECTOMY Left   . WISDOM TOOTH EXTRACTION      Current Outpatient Medications on File Prior to Visit  Medication Sig Dispense Refill  . levothyroxine (SYNTHROID) 50 MCG tablet Take 50 mcg by mouth daily before breakfast.    . prenatal vitamin w/FE, FA (PRENATAL 1 + 1) 27-1 MG TABS tablet Take 1 tablet by mouth daily at 12 noon.     No current facility-administered medications on file prior to visit.    Allergies  Allergen Reactions  . Ciprofloxacin Hives    Social History:  reports that she has quit smoking. Her smoking use included cigarettes. She smoked 0.25 packs per day. She has never used smokeless tobacco. She reports that she does not drink alcohol and does not use drugs.  Family History  Problem Relation Age of Onset  . Hypertension Mother   . Arthritis Father     The following portions  of the patient's history were reviewed and updated as appropriate: allergies, current medications, past family history, past medical history, past social history, past surgical history and problem list.  Review of Systems Review of Systems    Physical Exam:  BP 121/78   Pulse 80   Wt 240 lb (108.9 kg)    BMI 38.74 kg/m  CONSTITUTIONAL: Well-developed, well-nourished female in no acute distress.  HENT:  Normocephalic, atraumatic, External right and left ear normal. Oropharynx is clear and moist EYES: Conjunctivae normal. No scleral icterus.  NECK: Normal range of motion, supple, no masses.  Normal thyroid.  SKIN: Skin is warm and dry. No rash noted. Not diaphoretic. No erythema. No pallor. MUSCULOSKELETAL: Normal range of motion. No tenderness.  No cyanosis, clubbing, or edema.   NEUROLOGIC: Alert and oriented to person, place, and time. Normal muscle tone coordination.  PSYCHIATRIC: Normal mood and affect. Normal behavior. Normal judgment and thought content. CARDIOVASCULAR: Normal heart rate noted, regular rhythm RESPIRATORY: Clear to auscultation bilaterally. Effort and breath sounds normal, no problems with respiration noted. BREASTS: Symmetric in size. No masses, skin changes, nipple drainage, or lymphadenopathy. ABDOMEN: Soft, normal bowel sounds, no distention noted.  No tenderness, rebound or guarding. Fundal ht: below pelvis PELVIC: Normal appearing external genitalia; normal appearing vaginal mucosa and cervix.  No abnormal discharge noted.  Pap smear obtained.  Normal uterine size, no other palpable masses, no uterine or adnexal tenderness. FHR: 150s   Assessment:    Pregnancy: G1P0000   Plan:   1. Supervision of high risk pregnancy, antepartum - Desires NIPT and carrier screening, drawn today - Is interested in waterbirth, reviews briefly the exclusion criteria and need for further assessment for eligibility. Plan for CNM visit in 3rd trimester - CBC/D/Plt+RPR+Rh+ABO+Rub Ab... - Culture, OB Urine - [place] MFM OB DETAIL +14 WK; Future - Cytology - PAP - TSH - aspirin EC 81 MG tablet; Take 1 tablet (81 mg total) by mouth daily. Take after 12 weeks for prevention of preeclampsia later in pregnancy  Dispense: 300 tablet; Refill: 2  2. Polycystic ovarian syndrome - Dx well prior  to pregnancy and was the reason for REI - Hemoglobin A1c in August 5.2% - aspirin EC 81 MG tablet; Take 1 tablet (81 mg total) by mouth daily. Take after 12 weeks for prevention of preeclampsia later in pregnancy  Dispense: 300 tablet; Refill: 2  3. Obesity affecting pregnancy, antepartum Reviewed recommendation for 11-15 lb weight gain Discussed protein rich diet and avoiding carbs - Hemoglobin A1c - aspirin EC 81 MG tablet; Take 1 tablet (81 mg total) by mouth daily. Take after 12 weeks for prevention of preeclampsia later in pregnancy  Dispense: 300 tablet; Refill: 2  4. Insulin resistance  5. Left tubal pregnancy with intrauterine pregnancy   Initial labs drawn. Prenatal vitamins. Problem list reviewed and updated. Reviewed in detail the nature of the practice with collaborative care between  Genetic screening discussed: NIPS/First trimester screen/Quad/AFP requested. Role of ultrasound in pregnancy discussed; Anatomy US: requested. Amniocentesis discussed: not indicated. Follow up in 4 weeks. Discussed clinic routines, schedule of care and testing, genetic screening options, involvement of students and residents under the direct supervision of APPs and doctors and presence of female providers. Pt verbalized understanding.   Federico Flake, MD 06/15/2020 8:48 AM

## 2020-06-15 NOTE — Progress Notes (Signed)
Unsure of last pap , possibly over 3 years

## 2020-06-16 LAB — COMPREHENSIVE METABOLIC PANEL
ALT: 8 IU/L (ref 0–32)
AST: 13 IU/L (ref 0–40)
Albumin/Globulin Ratio: 1.5 (ref 1.2–2.2)
Albumin: 4.2 g/dL (ref 3.8–4.8)
Alkaline Phosphatase: 56 IU/L (ref 44–121)
BUN/Creatinine Ratio: 14 (ref 9–23)
BUN: 11 mg/dL (ref 6–20)
Bilirubin Total: 0.3 mg/dL (ref 0.0–1.2)
CO2: 19 mmol/L — ABNORMAL LOW (ref 20–29)
Calcium: 9.7 mg/dL (ref 8.7–10.2)
Chloride: 104 mmol/L (ref 96–106)
Creatinine, Ser: 0.81 mg/dL (ref 0.57–1.00)
GFR calc Af Amer: 109 mL/min/{1.73_m2} (ref >59)
GFR calc non Af Amer: 94 mL/min/{1.73_m2} (ref >59)
Globulin, Total: 2.8 g/dL (ref 1.5–4.5)
Glucose: 80 mg/dL (ref 65–99)
Potassium: 4.1 mmol/L (ref 3.5–5.2)
Sodium: 138 mmol/L (ref 134–144)
Total Protein: 7 g/dL (ref 6.0–8.5)

## 2020-06-16 LAB — CBC/D/PLT+RPR+RH+ABO+RUB AB...
Antibody Screen: NEGATIVE
Basophils Absolute: 0 10*3/uL (ref 0.0–0.2)
Basos: 0 %
EOS (ABSOLUTE): 0.2 10*3/uL (ref 0.0–0.4)
Eos: 5 %
HCV Ab: <0.1 s/co ratio (ref 0.0–0.9)
HIV Screen 4th Generation wRfx: NONREACTIVE
Hematocrit: 35.7 % (ref 34.0–46.6)
Hemoglobin: 12 g/dL (ref 11.1–15.9)
Hepatitis B Surface Ag: NEGATIVE
Immature Grans (Abs): 0 10*3/uL (ref 0.0–0.1)
Immature Granulocytes: 0 %
Lymphocytes Absolute: 1.5 10*3/uL (ref 0.7–3.1)
Lymphs: 30 %
MCH: 29.3 pg (ref 26.6–33.0)
MCHC: 33.6 g/dL (ref 31.5–35.7)
MCV: 87 fL (ref 79–97)
Monocytes Absolute: 0.3 10*3/uL (ref 0.1–0.9)
Monocytes: 6 %
Neutrophils Absolute: 2.9 10*3/uL (ref 1.4–7.0)
Neutrophils: 59 %
Platelets: 223 10*3/uL (ref 150–450)
RBC: 4.1 x10E6/uL (ref 3.77–5.28)
RDW: 12.6 % (ref 11.7–15.4)
RPR Ser Ql: NONREACTIVE
Rh Factor: POSITIVE
Rubella Antibodies, IGG: 2.7 index (ref >0.99)
WBC: 4.9 10*3/uL (ref 3.4–10.8)

## 2020-06-16 LAB — TSH: TSH: 2.27 u[IU]/mL (ref 0.450–4.500)

## 2020-06-16 LAB — HEMOGLOBIN A1C
Est. average glucose Bld gHb Est-mCnc: 100 mg/dL
Hgb A1c MFr Bld: 5.1 % (ref 4.8–5.6)

## 2020-06-16 LAB — HCV INTERPRETATION

## 2020-06-16 LAB — PROTEIN / CREATININE RATIO, URINE
Creatinine, Urine: 102.1 mg/dL
Protein, Ur: 6.8 mg/dL
Protein/Creat Ratio: 67 mg/g creat (ref 0–200)

## 2020-06-17 LAB — URINE CULTURE, OB REFLEX

## 2020-06-17 LAB — CULTURE, OB URINE

## 2020-06-17 LAB — SPECIMEN STATUS REPORT

## 2020-06-17 LAB — VITAMIN D 25 HYDROXY (VIT D DEFICIENCY, FRACTURES): Vit D, 25-Hydroxy: 30 ng/mL (ref 30.0–100.0)

## 2020-06-20 ENCOUNTER — Encounter: Payer: Self-pay | Admitting: *Deleted

## 2020-06-20 LAB — CYTOLOGY - PAP
Chlamydia: NEGATIVE
Comment: NEGATIVE
Comment: NEGATIVE
Comment: NORMAL
Diagnosis: NEGATIVE
Diagnosis: REACTIVE
High risk HPV: NEGATIVE
Neisseria Gonorrhea: NEGATIVE

## 2020-06-21 ENCOUNTER — Telehealth: Payer: Self-pay | Admitting: Radiology

## 2020-06-21 ENCOUNTER — Encounter: Payer: Self-pay | Admitting: Radiology

## 2020-06-21 NOTE — Telephone Encounter (Signed)
Patient was informed of Panorama results including fetal sex 

## 2020-06-23 ENCOUNTER — Ambulatory Visit
Admission: EM | Admit: 2020-06-23 | Discharge: 2020-06-23 | Disposition: A | Discharge status: Home / Self Care | Payer: 59 | Attending: Family Medicine | Admitting: Family Medicine

## 2020-06-23 ENCOUNTER — Other Ambulatory Visit: Payer: Self-pay

## 2020-06-23 ENCOUNTER — Encounter: Payer: Self-pay | Admitting: Emergency Medicine

## 2020-06-23 DIAGNOSIS — J069 Acute upper respiratory infection, unspecified: Secondary | ICD-10-CM

## 2020-06-23 NOTE — ED Triage Notes (Signed)
Woke during the night with a sore throat, nasal congestion, intermittent headache, and ears stuffy

## 2020-06-23 NOTE — ED Provider Notes (Signed)
[name]    CSN: [id number] Arrival date & time: 06/23/20  1159      History   Chief Complaint Chief Complaint  Patient presents with  . Sore Throat    HPI Brittney Salazar is a 35 y.o. female.   Woke during the night with a sore throat, nasal congestion, intermittent headache, and ears stuffy  Presenting today with sore throat, congestion, mild headache, ear pain and pressure that started overnight.  She denies coughing, chest pain, shortness of breath, abdominal pain, nausea, vomiting, diarrhea, fever, body aches.  So far not taking anything over-the-counter for symptoms.  Husband also sick with similar symptoms.  Of note, she is currently pregnant.     Past Medical History:  Diagnosis Date  . Hx of metrorrhagia     Patient Active Problem List   Diagnosis Date Noted  . Supervision of high risk pregnancy, antepartum 06/15/2020  . Obesity affecting pregnancy, antepartum 06/15/2020  . Left tubal pregnancy with intrauterine pregnancy 05/18/2020  . BMI 40.0-44.9, adult (HCC) 08/09/2019  . 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 Polycystic ovarian syndrome 08/07/2011  . Elevated testosterone level in female 08/07/2011  . Herpes simplex 08/07/2011  . Vitamin D deficiency 08/07/2011  . Hirsutism 08/07/2011    Past Surgical History:  Procedure Laterality Date  . SALPINGECTOMY Left   . WISDOM TOOTH EXTRACTION      OB History    Gravida  1   Para  0   Term  0   Preterm  0   AB  0   Living        SAB  0   IAB  0   Ectopic  0   Multiple      Live Births               Home Medications    Prior to Admission medications   Medication Sig Start Date End Date Taking? Authorizing Provider  aspirin EC 81 MG tablet Take 1 tablet (81 mg total) by mouth daily. Take after 12 weeks for prevention of preeclampsia later in pregnancy 06/15/20  Yes Federico Flake, MD  levothyroxine (SYNTHROID) 50 MCG tablet Take 50 mcg by  mouth daily before breakfast.   Yes [provider]  prenatal vitamin w/FE, FA (PRENATAL 1 + 1) 27-1 MG TABS tablet Take 1 tablet by mouth daily at 12 noon.   Yes [provider]    Family History Family History  Problem Relation Age of Onset  . Hypertension Mother   . Arthritis Father     Social History Social History   Tobacco Use  . Smoking status: Former Smoker    Packs/day: 0.25    Types: Cigarettes  . Smokeless tobacco: Never Used  Vaping Use  . Vaping Use: Never used  Substance Use Topics  . Alcohol use: No  . Drug use: No     Allergies   Ciprofloxacin   Review of Systems Review of Systems Per HPI  Physical Exam Triage Vital Signs ED Triage Vitals  Enc Vitals Group     BP 06/23/20 1315 104/69     Pulse Rate 06/23/20 1315 71     Resp 06/23/20 1315 20     Temp 06/23/20 1315 98 F (36.7 C)     Temp Source 06/23/20 1315 Oral     SpO2 06/23/20 1315 98 %     Weight --      Height --  Head Circumference --      Peak Flow --      Pain Score 06/23/20 1339 4     Pain Loc --      Pain Edu? --      Excl. in GC? --    No data found.  Updated Vital Signs BP 104/69 (BP Location: Right Arm)   Pulse 71   Temp 98 F (36.7 C) (Oral)   Resp 20   SpO2 98%   Visual Acuity Right Eye Distance:   Left Eye Distance:   Bilateral Distance:    Right Eye Near:   Left Eye Near:    Bilateral Near:     Physical Exam Vitals and nursing note reviewed.  Constitutional:      Appearance: Normal appearance. She is not ill-appearing.  HENT:     Head: Atraumatic.     Ears:     Comments: Mild bilateral middle ear effusions    Nose: Rhinorrhea present.     Mouth/Throat:     Mouth: Mucous membranes are moist.     Pharynx: Oropharynx is clear. No posterior oropharyngeal erythema.  Eyes:     Extraocular Movements: Extraocular movements intact.     Conjunctiva/sclera: Conjunctivae normal.  Cardiovascular:     Rate and Rhythm: Normal rate and  regular rhythm.     Heart sounds: Normal heart sounds.  Pulmonary:     Effort: Pulmonary effort is normal. No respiratory distress.     Breath sounds: Normal breath sounds. No wheezing or rales.  Abdominal:     General: Bowel sounds are normal. There is no distension.     Palpations: Abdomen is soft.     Tenderness: There is no abdominal tenderness. There is no guarding.  Musculoskeletal:        General: Normal range of motion.     Cervical back: Normal range of motion and neck supple.  Skin:    General: Skin is warm and dry.  Neurological:     Mental Status: She is alert and oriented to person, place, and time.  Psychiatric:        Mood and Affect: Mood normal.        Thought Content: Thought content normal.        Judgment: Judgment normal.      UC Treatments / Results  Labs (all labs ordered are listed, but only abnormal results are displayed) Labs Reviewed  NOVEL CORONAVIRUS, NAA    EKG   Radiology No results found.  Procedures Procedures (including critical care time)  Medications Ordered in UC Medications - No data to display  Initial Impression / Assessment and Plan / UC Course  I have reviewed the triage vital signs and the nursing notes.  Pertinent labs & imaging results that were available during my care of the patient were reviewed by me and considered in my medical decision making (see chart for details).     She is well-appearing today, exam and vitals very reassuring.  Covid PCR pending, work note given in isolation instructions reviewed.  Discussed pregnancy safe over-the-counter remedies such as Flonase, Tylenol.  Supportive care and strict return precautions reviewed.  Final Clinical Impressions(s) / UC Diagnoses   Final diagnoses:  Viral URI   Discharge Instructions   None    ED Prescriptions    None     PDMP not reviewed this encounter.   Particia Nearing, [place] 06/23/20 1435

## 2020-06-25 LAB — SARS-COV-2, NAA 2 DAY TAT

## 2020-06-25 LAB — NOVEL CORONAVIRUS, NAA: SARS-CoV-2, NAA: NOT DETECTED

## 2020-07-01 ENCOUNTER — Encounter: Payer: Self-pay | Admitting: *Deleted

## 2020-07-01 ENCOUNTER — Encounter: Payer: Self-pay | Admitting: Family Medicine

## 2020-07-01 ENCOUNTER — Telehealth: Payer: Self-pay | Admitting: *Deleted

## 2020-07-01 DIAGNOSIS — O0990 Supervision of high risk pregnancy, unspecified, unspecified trimester: Secondary | ICD-10-CM

## 2020-07-01 DIAGNOSIS — D563 Thalassemia minor: Secondary | ICD-10-CM | POA: Insufficient documentation

## 2020-07-01 NOTE — Telephone Encounter (Signed)
Pt informed of [place] results, discussed with pt about partner getting tested, She will discuss with him and we will give her a partner saliva Kit at her next visit.

## 2020-07-13 ENCOUNTER — Other Ambulatory Visit: Payer: Self-pay

## 2020-07-13 ENCOUNTER — Ambulatory Visit (INDEPENDENT_AMBULATORY_CARE_PROVIDER_SITE_OTHER): Payer: 59 | Admitting: Family Medicine

## 2020-07-13 VITALS — BP 118/75 | HR 73 | Wt 244.0 lb

## 2020-07-13 DIAGNOSIS — O0990 Supervision of high risk pregnancy, unspecified, unspecified trimester: Secondary | ICD-10-CM

## 2020-07-13 DIAGNOSIS — N970 Female infertility associated with anovulation: Secondary | ICD-10-CM

## 2020-07-13 DIAGNOSIS — D563 Thalassemia minor: Secondary | ICD-10-CM

## 2020-07-13 DIAGNOSIS — O99210 Obesity complicating pregnancy, unspecified trimester: Secondary | ICD-10-CM

## 2020-07-13 DIAGNOSIS — O00112 Left tubal pregnancy with intrauterine pregnancy: Secondary | ICD-10-CM

## 2020-07-13 NOTE — Patient Instructions (Signed)

## 2020-07-13 NOTE — Progress Notes (Signed)
   PRENATAL VISIT NOTE  Subjective:  Brittney Salazar is a 35 y.o. G1P0000 at [redacted]w[redacted]d being seen today for ongoing prenatal care.  She is currently monitored for the following issues for this high-risk pregnancy and has Infertility associated with anovulation; Polycystic ovarian syndrome; Elevated testosterone level in female; Herpes simplex; Vitamin D deficiency; Hirsutism; Supervision of high risk pregnancy, antepartum; Left tubal pregnancy with intrauterine pregnancy; BMI 40.0-44.9, adult ([place]); Obesity affecting pregnancy, antepartum; and Alpha thalassemia silent carrier on their problem list.  Patient reports bleeding-intermittent, mild. Brown in color. No recent sex.  Contractions: Irritability. Vag. Bleeding: None.   . Denies leaking of fluid.   The following portions of the patient's history were reviewed and updated as appropriate: allergies, current medications, past family history, past medical history, past social history, past surgical history and problem list.   Objective:   Vitals:   07/13/20 1527  BP: 118/75  Pulse: 73  Weight: 244 lb (110.7 kg)    Fetal Status: Fetal Heart Rate (bpm): 140 Fundal Height: 15 cm       General:  Alert, oriented and cooperative. Patient is in no acute distress.  Skin: Skin is warm and dry. No rash noted.   Cardiovascular: Normal heart rate noted  Respiratory: Normal respiratory effort, no problems with respiration noted  Abdomen: Soft, gravid, appropriate for gestational age.  Pain/Pressure: Absent     Pelvic: Cervical exam deferred        Extremities: Normal range of motion.  Edema: None  Mental Status: Normal mood and affect. Normal behavior. Normal judgment and thought content.   Assessment and Plan:  Pregnancy: G1P0000 at [redacted]w[redacted]d  1. Supervision of high risk pregnancy, antepartum Up to date Had some spotting, brown mostly and intermittent. Reviewed normality of this in early pregnancy Encouraged her to download baby script for typical  pregnancy complaints Has [place] scheduled - Enroll Patient in PreNatal Babyscripts  2. Obesity affecting pregnancy, antepartum TWG=4 lb (1.814 kg) which is at goal. Encouraged patient's effort to control weight gain - Enroll Patient in PreNatal Babyscripts  3. Infertility associated with anovulation IUI to achieve pregnancy  4. Alpha thalassemia silent carrier Partner given saliva kit today. He is known sickle cell trait carrier  5. Left tubal pregnancy with intrauterine pregnancy   Preterm labor symptoms and general obstetric precautions including but not limited to vaginal bleeding, contractions, leaking of fluid and fetal movement were reviewed in detail with the patient. Please refer to After Visit Summary for other counseling recommendations.   Return in about 4 weeks (around 08/10/2020) for scheduled visit.  Future Appointments  Date Time Provider [place]  08/09/2020 10:15 AM [facility] NURSE [facility]  08/09/2020 10:30 AM [facility] US3 [facility]  08/10/2020  1:45 PM Caren Macadam, MD [facility]    Caren Macadam, MD

## 2020-08-09 ENCOUNTER — Encounter: Payer: Self-pay | Admitting: *Deleted

## 2020-08-09 ENCOUNTER — Other Ambulatory Visit: Payer: Self-pay | Admitting: *Deleted

## 2020-08-09 ENCOUNTER — Ambulatory Visit: Payer: 59 | Attending: Family Medicine

## 2020-08-09 ENCOUNTER — Ambulatory Visit: Payer: 59 | Admitting: *Deleted

## 2020-08-09 ENCOUNTER — Other Ambulatory Visit: Payer: Self-pay

## 2020-08-09 DIAGNOSIS — O0990 Supervision of high risk pregnancy, unspecified, unspecified trimester: Secondary | ICD-10-CM

## 2020-08-09 DIAGNOSIS — O09513 Supervision of elderly primigravida, third trimester: Secondary | ICD-10-CM

## 2020-08-10 ENCOUNTER — Ambulatory Visit (INDEPENDENT_AMBULATORY_CARE_PROVIDER_SITE_OTHER): Payer: 59 | Admitting: Family Medicine

## 2020-08-10 VITALS — BP 128/75 | HR 69 | Wt 254.0 lb

## 2020-08-10 DIAGNOSIS — O99210 Obesity complicating pregnancy, unspecified trimester: Secondary | ICD-10-CM

## 2020-08-10 DIAGNOSIS — O0990 Supervision of high risk pregnancy, unspecified, unspecified trimester: Secondary | ICD-10-CM

## 2020-08-10 NOTE — Progress Notes (Signed)
   PRENATAL VISIT NOTE  Subjective:  Brittney Salazar is a 35 y.o. G1P0000 at [redacted]w[redacted]d being seen today for ongoing prenatal care.  She is currently monitored for the following issues for this low-risk pregnancy and has Infertility associated with anovulation; Polycystic ovarian syndrome; Elevated testosterone level in female; Herpes simplex; Vitamin D deficiency; Hirsutism; Supervision of high risk pregnancy, antepartum; Left tubal pregnancy with intrauterine pregnancy; BMI 40.0-44.9, adult (HCC); Obesity affecting pregnancy, antepartum; and Alpha thalassemia silent carrier on their problem list.  Patient reports no complaints.  Contractions: Not present. Vag. Bleeding: None.  Movement: Present. Denies leaking of fluid.   The following portions of the patient's history were reviewed and updated as appropriate: allergies, current medications, past family history, past medical history, past social history, past surgical history and problem list.   Objective:   Vitals:   08/10/20 1340  BP: 128/75  Pulse: 69  Weight: 254 lb (115.2 kg)    Fetal Status: Fetal Heart Rate (bpm): 145   Movement: Present     General:  Alert, oriented and cooperative. Patient is in no acute distress.  Skin: Skin is warm and dry. No rash noted.   Cardiovascular: Normal heart rate noted  Respiratory: Normal respiratory effort, no problems with respiration noted  Abdomen: Soft, gravid, appropriate for gestational age.  Pain/Pressure: Present     Pelvic: Cervical exam deferred        Extremities: Normal range of motion.  Edema: None  Mental Status: Normal mood and affect. Normal behavior. Normal judgment and thought content.   Assessment and Plan:  Pregnancy: G1P0000 at [redacted]w[redacted]d  1. Supervision of high risk pregnancy, antepartum UP to date Reviewed [place] results Reviewed safety to continue working Discussed breastfeeding support Reviewed newborn care in hospital  2. Obesity affecting pregnancy, antepartum TWG= 14 lb  (6.35 kg)    Preterm labor symptoms and general obstetric precautions including but not limited to vaginal bleeding, contractions, leaking of fluid and fetal movement were reviewed in detail with the patient. Please refer to After Visit Summary for other counseling recommendations.   Return in about 4 weeks (around 09/07/2020) for Routine prenatal care, in person.  Future Appointments  Date Time Provider Department Center  09/08/2020 10:15 AM [place] Large, Jethro Bastos, MD [facility]  10/11/2020  9:00 AM [facility] NURSE [facility]  10/11/2020  9:15 AM [facility] US2 [facility]    Federico Flake, MD

## 2020-08-22 ENCOUNTER — Other Ambulatory Visit: Payer: Self-pay | Admitting: *Deleted

## 2020-08-22 DIAGNOSIS — D563 Thalassemia minor: Secondary | ICD-10-CM

## 2020-09-01 ENCOUNTER — Other Ambulatory Visit: Payer: Self-pay

## 2020-09-01 ENCOUNTER — Inpatient Hospital Stay (HOSPITAL_COMMUNITY)
Admission: AD | Admit: 2020-09-01 | Discharge: 2020-09-01 | Disposition: A | Discharge status: Home / Self Care | Payer: 59 | Attending: Obstetrics & Gynecology | Admitting: Obstetrics & Gynecology

## 2020-09-01 ENCOUNTER — Encounter (HOSPITAL_COMMUNITY): Payer: Self-pay | Admitting: Obstetrics & Gynecology

## 2020-09-01 ENCOUNTER — Inpatient Hospital Stay (HOSPITAL_BASED_OUTPATIENT_CLINIC_OR_DEPARTMENT_OTHER): Payer: 59

## 2020-09-01 DIAGNOSIS — O4692 Antepartum hemorrhage, unspecified, second trimester: Secondary | ICD-10-CM | POA: Diagnosis present

## 2020-09-01 DIAGNOSIS — O0992 Supervision of high risk pregnancy, unspecified, second trimester: Secondary | ICD-10-CM | POA: Diagnosis not present

## 2020-09-01 DIAGNOSIS — O4690 Antepartum hemorrhage, unspecified, unspecified trimester: Secondary | ICD-10-CM

## 2020-09-01 DIAGNOSIS — Z3A22 22 weeks gestation of pregnancy: Secondary | ICD-10-CM | POA: Insufficient documentation

## 2020-09-01 DIAGNOSIS — O0990 Supervision of high risk pregnancy, unspecified, unspecified trimester: Secondary | ICD-10-CM

## 2020-09-01 DIAGNOSIS — Z6790 Unspecified blood type, Rh positive: Secondary | ICD-10-CM

## 2020-09-01 DIAGNOSIS — Z3686 Encounter for antenatal screening for cervical length: Secondary | ICD-10-CM | POA: Diagnosis not present

## 2020-09-01 DIAGNOSIS — Z87891 Personal history of nicotine dependence: Secondary | ICD-10-CM | POA: Insufficient documentation

## 2020-09-01 LAB — URINALYSIS, ROUTINE W REFLEX MICROSCOPIC
Bacteria, UA: NONE SEEN
Bilirubin Urine: NEGATIVE
Glucose, UA: NEGATIVE mg/dL
Ketones, ur: NEGATIVE mg/dL
Leukocytes,Ua: NEGATIVE
Nitrite: NEGATIVE
Protein, ur: NEGATIVE mg/dL
Specific Gravity, Urine: 1.015 (ref 1.005–1.030)
pH: 7 (ref 5.0–8.0)

## 2020-09-01 LAB — WET PREP, GENITAL
Sperm: NONE SEEN
Trich, Wet Prep: NONE SEEN
Yeast Wet Prep HPF POC: NONE SEEN

## 2020-09-01 NOTE — MAU Note (Signed)
Started having some pain in abdomen yesterday and thought it was baby kicking. Last night had some small, stringy clots. Continued to have some cramping in stomach all night. This am when I wiped there was some pink on tissue when I wiped. No recent intercourse. After lunch had more stringy blood when wiped that was light red. Still having slight cramping. States feels like I have to pee a lot.

## 2020-09-01 NOTE — MAU Provider Note (Addendum)
History     CSN: [id number]  Arrival date and time: 09/01/20 4098   Event Date/Time   First Provider Initiated Contact with Patient 09/01/20 2128      Chief Complaint  Patient presents with  . Vaginal Bleeding  . Dysuria   Brittney Salazar is a 35 y.o. G1P0000 at [redacted]w[redacted]d who presents to MAU for vaginal bleeding which began yesterday. Patient reports since yesterday, the bleeding has only been "spotting when wiping and stringy clots." Patient endorses pink spotting today. Patient reports last intercourse was about one week ago. Patient endorses pain in the lower abdomen/pelvis and reports feeling like a mixture of menstrual-like cramping and "hard" pains. Patient reports last night the pain was waking her from sleep. Patient just thought the baby was uncomfortable, so she did not think too much about it. Patient also reports pelvic pressure that also started along with the stomach pains. Patient also reports on her way to MAU she felt like her whole stomach was tight. Patient reports she feels like something is pushing down from the inside. Patient reports she has not been wearing a pad. Patient reports she has not taken anything for pain.  Current pregnancy problems? IUI Blood Type? O Positive Allergies? Cipro Current medications? Low dose ASA, Levothyroxine, PNVs Current [facility] & next appt? [facility] [place], 09/08/2020   OB History    Gravida  1   Para  0   Term  0   Preterm  0   AB  0   Living        SAB  0   IAB  0   Ectopic  0   Multiple      Live Births              Past Medical History:  Diagnosis Date  . Hx of metrorrhagia     Past Surgical History:  Procedure Laterality Date  . SALPINGECTOMY Left   . WISDOM TOOTH EXTRACTION      Family History  Problem Relation Age of Onset  . Hypertension Mother   . Arthritis Father     Social History   Tobacco Use  . Smoking status: Former Smoker    Packs/day: 0.25    Types: Cigarettes  . Smokeless  tobacco: Never Used  Vaping Use  . Vaping Use: Never used  Substance Use Topics  . Alcohol use: No  . Drug use: No    Allergies:  Allergies  Allergen Reactions  . Ciprofloxacin Hives    No medications prior to admission.    Review of Systems  Constitutional: Negative for chills, diaphoresis, fatigue and fever.  Eyes: Negative for visual disturbance.  Respiratory: Negative for shortness of breath.   Cardiovascular: Negative for chest pain.  Gastrointestinal: Positive for abdominal pain. Negative for constipation, diarrhea, nausea and vomiting.  Genitourinary: Positive for pelvic pain and vaginal bleeding. Negative for dysuria, flank pain, frequency, urgency and vaginal discharge.  Neurological: Negative for dizziness, weakness, light-headedness and headaches.   Physical Exam   Blood pressure 114/67, pulse 79, temperature (!) 77 F (25 C), resp. rate 16, height  (1.676 m), weight 118.4 kg.  Patient Vitals for the past 24 hrs:  BP Temp Pulse Resp Height Weight  09/02/20 0024 114/67 (!) 77 F (25 C) -- 16 -- --  09/01/20 2046 133/72 -- 79 -- -- --  09/01/20 2000 132/65 -- 75 -- -- --  09/01/20 1952 -- 98 F (36.7 C) -- 18  (1.676 m)  118.4 kg   Physical Exam Vitals and nursing note reviewed.  Constitutional:      General: She is not in acute distress.    Appearance: Normal appearance. She is not ill-appearing, toxic-appearing or diaphoretic.  HENT:     Head: Normocephalic and atraumatic.  Pulmonary:     Effort: Pulmonary effort is normal.  Neurological:     Mental Status: She is alert and oriented to person, place, and time.  Psychiatric:        Mood and Affect: Mood normal.        Behavior: Behavior normal.        Thought Content: Thought content normal.        Judgment: Judgment normal.    Results for orders placed or performed during the hospital encounter of 09/01/20 (from the past 24 hour(s))  Urinalysis, Routine w reflex microscopic Urine, Clean  Catch     Status: Abnormal   Collection Time: 09/01/20  8:03 PM  Result Value Ref Range   Color, Urine YELLOW YELLOW   APPearance CLEAR CLEAR   Specific Gravity, Urine 1.015 1.005 - 1.030   pH 7.0 5.0 - 8.0   Glucose, UA NEGATIVE NEGATIVE mg/dL   Hgb urine dipstick MODERATE (A) NEGATIVE   Bilirubin Urine NEGATIVE NEGATIVE   Ketones, ur NEGATIVE NEGATIVE mg/dL   Protein, ur NEGATIVE NEGATIVE mg/dL   Nitrite NEGATIVE NEGATIVE   Leukocytes,Ua NEGATIVE NEGATIVE   WBC, UA 0-5 0 - 5 WBC/hpf   Bacteria, UA NONE SEEN NONE SEEN   Squamous Epithelial / LPF 0-5 0 - 5   Mucus PRESENT   Wet prep, genital     Status: Abnormal   Collection Time: 09/01/20  9:56 PM   Specimen: PATH Cytology Cervicovaginal Ancillary Only  Result Value Ref Range   Yeast Wet Prep HPF POC NONE SEEN NONE SEEN   Trich, Wet Prep NONE SEEN NONE SEEN   Clue Cells Wet Prep HPF POC PRESENT (A) NONE SEEN   WBC, Wet Prep HPF POC FEW (A) NONE SEEN   Sperm NONE SEEN    [place] MFM OB DETAIL +14 WK  Result Date: 08/09/2020 ----------------------------------------------------------------------  OBSTETRICS REPORT                       (Signed Final 08/09/2020 11:47 am) ---------------------------------------------------------------------- Patient Info  ID #:       [id number]                          D.O.B.:  March 02, 1986 (34 yrs)  Name:       KHANH CORDNER                 Visit Date: 08/09/2020 10:31 am ---------------------------------------------------------------------- Performed By  Attending:        Ma Rings MD         Ref. Address:     [facility]                                                             [address]                                                             [address]                                                              [place], [place]                                                             [address]  Performed By:     Emeline Darling BS,      [facility]  Referred By:      Kirkland Hun MD ---------------------------------------------------------------------- Orders  #  Description                           Code        Ordered By  1  [place] MFM OB DETAIL +14  WK               [id number]    Lyndel Safe ----------------------------------------------------------------------  #  Order #                     Accession #                Episode #  1  [id number]                   [id number]                 [id number] ---------------------------------------------------------------------- Indications  Advanced maternal age multigravida 23+,        O80.522  second trimester (34 yrs)  Obesity complicating pregnancy, second         O99.212  trimester (Pregravid BMI 38)  Hypothyroid                                    O99.280 E03.9  [redacted] weeks gestation of pregnancy                Z3A.19  Encounter for antenatal screening for          Z36.3  malformations  Low Risk NIPS  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 Cord Insertion:      Velamentous insertion  Amniotic Fluid  AFI FV:      Within normal limits                              Largest Pocket(cm)                              7.1 ---------------------------------------------------------------------- Biometry  BPD:       45.8  mm     G. Age:  19w 6d         83  %    CI:        81.26   %    70 - 86                                                          FL/HC:      20.5   %    16.1 - 18.3  HC:      160.4  mm     G. Age:  18w 6d         35  %    HC/AC:      1.15        1.09 - 1.39  AC:      139.2  mm     G. Age:  19w 2d  57  %    FL/BPD:     71.8   %  FL:       32.9  mm     G. Age:  20w 2d         85  %    FL/AC:      23.6   %    20 - 24  CER:      20.4  mm     G. Age:  19w 4d         64  %  NFT:       3.3  mm  LV:        6.3  mm  CM:        2.9  mm  Est. FW:     307  gm    0 lb 11 oz      84  % ---------------------------------------------------------------------- OB History  Gravidity:    1 ---------------------------------------------------------------------- Gestational Age  Clinical EDD:  19w 0d                                        EDD:   01/03/21  U/S Today:     19w 4d                                        EDD:   12/30/20  Best:          19w 0d     Det. By:  Clinical EDD             EDD:   01/03/21 ---------------------------------------------------------------------- Anatomy  Cranium:               Appears normal         Aortic Arch:            Appears normal  Cavum:                 Appears normal         Ductal Arch:            Appears normal  Ventricles:            Appears normal         Diaphragm:              Appears normal  Choroid Plexus:        Appears normal         Stomach:                Appears normal, left                                                                        sided  Cerebellum:            Appears normal         Abdomen:                Appears normal  Posterior Fossa:       Appears normal  Abdominal Wall:         Appears nml (cord                                                                        insert, abd wall)  Nuchal Fold:           Appears normal         Cord Vessels:           Appears normal (3                                                                         vessel cord)  Face:                  Appears normal         Kidneys:                Appear normal                         (orbits and profile)  Lips:                  Appears normal         Bladder:                Appears normal  Thoracic:              Appears normal         Spine:                  Appears normal  Heart:                 Appears normal         Upper Extremities:      Appears normal                         (4CH, axis, and                         situs)  RVOT:                  Appears normal         Lower Extremities:      Appears normal  LVOT:                  Appears normal  Other:  Fetus appears to be a female. Heels visualized. Technically difficult due          to maternal habitus and fetal position. ---------------------------------------------------------------------- Cervix Uterus Adnexa  Cervix  Length:           3.03  cm.  Normal appearance by transabdominal scan. ---------------------------------------------------------------------- Comments  This patient was seen for a detailed fetal anatomy scan due  to advanced maternal age and maternal obesity.  She has a  history of hypothyroidism that  is currently treated with  Synthroid.  She denies any other significant past medical history and  denies any problems in her current pregnancy.  She had a cell free DNA test earlier in her pregnancy which  indicated a low risk for trisomy 60, 38, and 13. A female fetus is  predicted.  She was informed that the fetal growth and amniotic fluid  level were appropriate for her gestational age.  There were no obvious fetal anomalies noted on today's  ultrasound exam.  The patient was informed that anomalies may be missed due  to technical limitations. If the fetus is in a suboptimal position  or maternal habitus is increased, visualization of the fetus in  the maternal uterus may be impaired.  The increased risk of fetal aneuploidy due to advanced  maternal age was discussed. Due to advanced maternal age,  the  patient was offered and declined an amniocentesis today  for definitive diagnosis of fetal aneuploidy.  Due to her history of hypothyroidism, a follow-up growth scan  was scheduled at around 28 weeks (in 9 weeks). ----------------------------------------------------------------------                   Ma Rings, MD Electronically Signed Final Report   08/09/2020 11:47 am ----------------------------------------------------------------------  MAU Course  Procedures  MDM -care transferred to L. Leftwich-Kirby, CNM Nugent, Odie Sera, NP  10:04 PM 09/01/2020  Results for orders placed or performed during the hospital encounter of 09/01/20 (from the past 24 hour(s))  Urinalysis, Routine w reflex microscopic Urine, Clean Catch     Status: Abnormal   Collection Time: 09/01/20  8:03 PM  Result Value Ref Range   Color, Urine YELLOW YELLOW   APPearance CLEAR CLEAR   Specific Gravity, Urine 1.015 1.005 - 1.030   pH 7.0 5.0 - 8.0   Glucose, UA NEGATIVE NEGATIVE mg/dL   Hgb urine dipstick MODERATE (A) NEGATIVE   Bilirubin Urine NEGATIVE NEGATIVE   Ketones, ur NEGATIVE NEGATIVE mg/dL   Protein, ur NEGATIVE NEGATIVE mg/dL   Nitrite NEGATIVE NEGATIVE   Leukocytes,Ua NEGATIVE NEGATIVE   WBC, UA 0-5 0 - 5 WBC/hpf   Bacteria, UA NONE SEEN NONE SEEN   Squamous Epithelial / LPF 0-5 0 - 5   Mucus PRESENT   Wet prep, genital     Status: Abnormal   Collection Time: 09/01/20  9:56 PM   Specimen: PATH Cytology Cervicovaginal Ancillary Only  Result Value Ref Range   Yeast Wet Prep HPF POC NONE SEEN NONE SEEN   Trich, Wet Prep NONE SEEN NONE SEEN   Clue Cells Wet Prep HPF POC PRESENT (A) NONE SEEN   WBC, Wet Prep HPF POC FEW (A) NONE SEEN   Sperm NONE SEEN    OB Limited MFM [place] with preliminary findings of fundal placenta, no visual evidence of abruption, normal AFI, and cervical length of 3.8 cm. See MFM final report.  SSE:  Cervix visually closed with dark red/brown bleeding at cervical os. Cervix  not friable on exam.  MDM:    No evidence of preterm labor, placental abnormality, or cervical shortening. No acute findings.  Letter provided for pt to be out of work over the weekend, follow up in office next week as scheduled. Return to MAU with increased bleeding or pain.     Assessment and Plan   1. Vaginal bleeding in pregnancy, second trimester   2. Supervision of high risk pregnancy, antepartum   3. Vaginal bleeding in pregnancy   4. [redacted]  weeks gestation of pregnancy   5. Blood type, Rh positive    D/C home with bleeding precautions  Sharen Counter, CNM 4:26 AM

## 2020-09-02 ENCOUNTER — Encounter: Payer: Self-pay | Admitting: Radiology

## 2020-09-02 LAB — GC/CHLAMYDIA PROBE AMP (~~LOC~~) NOT AT ARMC
Chlamydia: NEGATIVE
Comment: NEGATIVE
Comment: NORMAL
Neisseria Gonorrhea: NEGATIVE

## 2020-09-02 NOTE — Progress Notes (Signed)
Lisa Leftwich-Kirby CNM in to discuss test results and d/c plan. Written and verbal d/c instructions given and understanding voiced. 

## 2020-09-08 ENCOUNTER — Encounter: Payer: Self-pay | Admitting: Obstetrics & Gynecology

## 2020-09-08 ENCOUNTER — Other Ambulatory Visit: Payer: Self-pay

## 2020-09-08 ENCOUNTER — Ambulatory Visit (INDEPENDENT_AMBULATORY_CARE_PROVIDER_SITE_OTHER): Payer: 59 | Admitting: Obstetrics & Gynecology

## 2020-09-08 VITALS — BP 139/83 | HR 99 | Wt 260.0 lb

## 2020-09-08 DIAGNOSIS — Z3A23 23 weeks gestation of pregnancy: Secondary | ICD-10-CM

## 2020-09-08 DIAGNOSIS — O0990 Supervision of high risk pregnancy, unspecified, unspecified trimester: Secondary | ICD-10-CM

## 2020-09-08 DIAGNOSIS — O99210 Obesity complicating pregnancy, unspecified trimester: Secondary | ICD-10-CM

## 2020-09-08 NOTE — Patient Instructions (Addendum)
Deciding about Circumcision in Baby Boys  (The Basics)  What is circumcision?  Circumcision is a surgery that removes the skin that covers the tip of the penis, called the "foreskin" Circumcision is usually done when a boy is between 53 and 29 days old. In the [place], circumcision is common. In some other countries, fewer boys are circumcised. Circumcision is a common tradition in some religions.  Should I have my baby boy circumcised?  There is no easy answer. Circumcision has some benefits. But it also has risks. After talking with your doctor, you will have to decide for yourself what is right for your family.  What are the benefits of circumcision?  Circumcised boys seem to have slightly lower rates of: ?Urinary tract infections ?Swelling of the opening at the tip of the penis Circumcised men seem to have slightly lower rates of: ?Urinary tract infections ?Swelling of the opening at the tip of the penis ?Penis cancer ?HIV and other infections that you catch during sex ?Cervical cancer in the women they have sex with Even so, in the [place], the risks of these problems are small - even in boys and men who have not been circumcised. Plus, boys and men who are not circumcised can reduce these extra risks by: ?Cleaning their penis well ?Using condoms during sex  What are the risks of circumcision?  Risks include: ?Bleeding or infection from the surgery ?Damage to or amputation of the penis ?A chance that the doctor will cut off too much or not enough of the foreskin ?A chance that sex won't feel as good later in life Only about 1 out of every 200 circumcisions leads to problems. There is also a chance that your health insurance won't pay for circumcision.  How is circumcision done in baby boys?  First, the baby gets medicine for pain relief. This might be a cream on the skin or a shot into the base of the penis. Next, the doctor cleans the baby's penis well. Then  he or she uses special tools to cut off the foreskin. Finally, the doctor wraps a bandage (called gauze) around the baby's penis. If you have your baby circumcised, his doctor or nurse will give you instructions on how to care for him after the surgery. It is important that you follow those instructions carefully.     Return to office for any scheduled appointments. Call the office or go to the MAU at [facility] if:  You begin to have strong, frequent contractions  Your water breaks.  Sometimes it is a big gush of fluid, sometimes it is just a trickle that keeps getting your panties wet or running down your legs  You have vaginal bleeding.  It is normal to have a small amount of spotting if your cervix was checked.   You do not feel your baby moving like normal.  If you do not, get something to eat and drink and lay down and focus on feeling your baby move.   If your baby is still not moving like normal, you should call the office or go to MAU.  Any other obstetric concerns.  TDaP Vaccine Pregnancy Get the Whooping Cough Vaccine While You Are Pregnant (CDC)  It is important for women to get the whooping cough vaccine in the third trimester of each pregnancy. Vaccines are the best way to prevent this disease. There are 2 different whooping cough vaccines. Both vaccines combine protection against whooping  cough, tetanus and diphtheria, but they are for different age groups: Tdap: for everyone 11 years or older, including pregnant women  DTaP: for children 2 months through 74 years of age  You need the whooping cough vaccine during each of your pregnancies The recommended time to get the shot is during your 27th through 36th week of pregnancy, preferably during the earlier part of this time period. The Centers for Disease Control and Prevention (CDC) recommends that pregnant women receive the whooping cough vaccine for adolescents and adults (called Tdap vaccine)  during the third trimester of each pregnancy. The recommended time to get the shot is during your 27th through 36th week of pregnancy, preferably during the earlier part of this time period. This replaces the original recommendation that pregnant women get the vaccine only if they had not previously received it. The [employer] of [employer] and [employer]necologists and the [employer] support this recommendation.  You should get the whooping cough vaccine while pregnant to pass protection to your baby frame support disabled and/or not supported in this browser  Learn why Vernona Rieger decided to get the whooping cough vaccine in her 3rd trimester of pregnancy and how her baby girl was born with some protection against the disease. Also available on YouTube. After receiving the whooping cough vaccine, your body will create protective antibodies (proteins produced by the body to fight off diseases) and pass some of them to your baby before birth. These antibodies provide your baby some short-term protection against whooping cough in early life. These antibodies can also protect your baby from some of the more serious complications that come along with whooping cough. Your protective antibodies are at their highest about 2 weeks after getting the vaccine, but it takes time to pass them to your baby. So the preferred time to get the whooping cough vaccine is early in your third trimester. The amount of whooping cough antibodies in your body decreases over time. That is why CDC recommends you get a whooping cough vaccine during each pregnancy. Doing so allows each of your babies to get the greatest number of protective antibodies from you. This means each of your babies will get the best protection possible against this disease.  Getting the whooping cough vaccine while pregnant is better than getting the vaccine after you give birth Whooping cough vaccination during pregnancy is ideal so  your baby will have short-term protection as soon as he is born. This early protection is important because your baby will not start getting his whooping cough vaccines until he is 2 months old. These first few months of life are when your baby is at greatest risk for catching whooping cough. This is also when he's at greatest risk for having severe, potentially life-threating complications from the infection. To avoid that gap in protection, it is best to get a whooping cough vaccine during pregnancy. You will then pass protection to your baby before he is born. To continue protecting your baby, he should get whooping cough vaccines starting at 2 months old. You may never have gotten the Tdap vaccine before and did not get it during this pregnancy. If so, you should make sure to get the vaccine immediately after you give birth, before leaving the hospital or birthing center. It will take about 2 weeks before your body develops protection (antibodies) in response to the vaccine. Once you have protection from the vaccine, you are less likely to give whooping cough to your newborn while caring  for him. But remember, your baby will still be at risk for catching whooping cough from others. A recent study looked to see how effective Tdap was at preventing whooping cough in babies whose mothers got the vaccine while pregnant or in the hospital after giving birth. The study found that getting Tdap between 27 through 36 weeks of pregnancy is 85% more effective at preventing whooping cough in babies younger than 2 months old. Blood tests cannot tell if you need a whooping cough vaccine There are no blood tests that can tell you if you have enough antibodies in your body to protect yourself or your baby against whooping cough. Even if you have been sick with whooping cough in the past or previously received the vaccine, you still should get the vaccine during each pregnancy. Breastfeeding may pass some protective  antibodies onto your baby By breastfeeding, you may pass some antibodies you have made in response to the vaccine to your baby. When you get a whooping cough vaccine during your pregnancy, you will have antibodies in your breast milk that you can share with your baby as soon as your milk comes in. However, your baby will not get protective antibodies immediately if you wait to get the whooping cough vaccine until after delivering your baby. This is because it takes about 2 weeks for your body to create antibodies. Learn more about the health benefits of breastfeeding.

## 2020-09-08 NOTE — Progress Notes (Signed)
   PRENATAL VISIT NOTE  Subjective:  Brittney Salazar is a 35 y.o. G1P0000 at [redacted]w[redacted]d being seen today for ongoing prenatal care.  She is currently monitored for the following issues for this high-risk pregnancy and has Infertility associated with anovulation; Polycystic ovarian syndrome; Elevated testosterone level in female; Herpes simplex; Vitamin D deficiency; Hirsutism; Supervision of high risk pregnancy, antepartum; Left tubal pregnancy with intrauterine pregnancy; BMI 40.0-44.9, adult (HCC); Maternal morbid obesity, antepartum (HCC); and Alpha thalassemia silent carrier on their problem list.  Patient reports no complaints.  Contractions: Not present. Vag. Bleeding: None.  Movement: Present. Denies leaking of fluid.   The following portions of the patient's history were reviewed and updated as appropriate: allergies, current medications, past family history, past medical history, past social history, past surgical history and problem list.   Objective:   Vitals:   09/08/20 1039  BP: 139/83  Pulse: 99  Weight: 260 lb (117.9 kg)    Fetal Status: Fetal Heart Rate (bpm): 140   Movement: Present     General:  Alert, oriented and cooperative. Patient is in no acute distress.  Skin: Skin is warm and dry. No rash noted.   Cardiovascular: Normal heart rate noted  Respiratory: Normal respiratory effort, no problems with respiration noted  Abdomen: Soft, gravid, appropriate for gestational age.  Pain/Pressure: Absent     Pelvic: Cervical exam deferred        Extremities: Normal range of motion.  Edema: None  Mental Status: Normal mood and affect. Normal behavior. Normal judgment and thought content.   Assessment and Plan:  Pregnancy: G1P0000 at [redacted]w[redacted]d 1. Maternal morbid obesity, antepartum (HCC) TWG 20, has upcoming growth scan.  Advised about healthy lifestyle habits.  2. [redacted] weeks gestation of pregnancy 3. Supervision of high risk pregnancy, antepartum Borderline BP today, BP cuff given  to her. Will check weekly at home. Going on vacation out of town, will return in 5 weeks. Preterm labor symptoms and general obstetric precautions including but not limited to vaginal bleeding, contractions, leaking of fluid and fetal movement were reviewed in detail with the patient. Please refer to After Visit Summary for other counseling recommendations.   Return in about 5 weeks (around 10/13/2020) for 2 hr GTT, 3rd trimester labs, TDap, OFFICE OB VISIT (MD only).  Future Appointments  Date Time Provider Department Center  10/11/2020  9:00 AM [facility] NURSE [facility]  10/11/2020  9:15 AM [facility]    Jaynie Collins, MD

## 2020-10-11 ENCOUNTER — Other Ambulatory Visit: Payer: Self-pay | Admitting: *Deleted

## 2020-10-11 ENCOUNTER — Encounter: Payer: Self-pay | Admitting: *Deleted

## 2020-10-11 ENCOUNTER — Ambulatory Visit: Payer: 59 | Admitting: *Deleted

## 2020-10-11 ENCOUNTER — Other Ambulatory Visit: Payer: Self-pay

## 2020-10-11 ENCOUNTER — Ambulatory Visit: Payer: 59 | Attending: Obstetrics

## 2020-10-11 VITALS — BP 119/63 | HR 77

## 2020-10-11 DIAGNOSIS — Z3A28 28 weeks gestation of pregnancy: Secondary | ICD-10-CM

## 2020-10-11 DIAGNOSIS — O0990 Supervision of high risk pregnancy, unspecified, unspecified trimester: Secondary | ICD-10-CM

## 2020-10-11 DIAGNOSIS — O43123 Velamentous insertion of umbilical cord, third trimester: Secondary | ICD-10-CM

## 2020-10-11 DIAGNOSIS — O321XX0 Maternal care for breech presentation, not applicable or unspecified: Secondary | ICD-10-CM

## 2020-10-11 DIAGNOSIS — O09513 Supervision of elderly primigravida, third trimester: Secondary | ICD-10-CM | POA: Insufficient documentation

## 2020-10-11 DIAGNOSIS — E039 Hypothyroidism, unspecified: Secondary | ICD-10-CM | POA: Diagnosis not present

## 2020-10-11 DIAGNOSIS — O09523 Supervision of elderly multigravida, third trimester: Secondary | ICD-10-CM | POA: Diagnosis not present

## 2020-10-11 DIAGNOSIS — O99283 Endocrine, nutritional and metabolic diseases complicating pregnancy, third trimester: Secondary | ICD-10-CM | POA: Diagnosis not present

## 2020-10-11 DIAGNOSIS — Z148 Genetic carrier of other disease: Secondary | ICD-10-CM

## 2020-10-13 ENCOUNTER — Other Ambulatory Visit: Payer: Self-pay

## 2020-10-13 ENCOUNTER — Ambulatory Visit (INDEPENDENT_AMBULATORY_CARE_PROVIDER_SITE_OTHER): Payer: 59 | Admitting: Obstetrics and Gynecology

## 2020-10-13 ENCOUNTER — Encounter: Payer: Self-pay | Admitting: Obstetrics and Gynecology

## 2020-10-13 VITALS — BP 114/71 | HR 73 | Wt 271.0 lb

## 2020-10-13 DIAGNOSIS — Z23 Encounter for immunization: Secondary | ICD-10-CM | POA: Diagnosis not present

## 2020-10-13 DIAGNOSIS — Z3A28 28 weeks gestation of pregnancy: Secondary | ICD-10-CM

## 2020-10-13 DIAGNOSIS — Z319 Encounter for procreative management, unspecified: Secondary | ICD-10-CM

## 2020-10-13 DIAGNOSIS — Z8619 Personal history of other infectious and parasitic diseases: Secondary | ICD-10-CM

## 2020-10-13 DIAGNOSIS — O09513 Supervision of elderly primigravida, third trimester: Secondary | ICD-10-CM

## 2020-10-13 DIAGNOSIS — O99210 Obesity complicating pregnancy, unspecified trimester: Secondary | ICD-10-CM

## 2020-10-13 DIAGNOSIS — O0990 Supervision of high risk pregnancy, unspecified, unspecified trimester: Secondary | ICD-10-CM

## 2020-10-13 DIAGNOSIS — Z8759 Personal history of other complications of pregnancy, childbirth and the puerperium: Secondary | ICD-10-CM

## 2020-10-13 DIAGNOSIS — O09523 Supervision of elderly multigravida, third trimester: Secondary | ICD-10-CM | POA: Insufficient documentation

## 2020-10-13 DIAGNOSIS — Z3A23 23 weeks gestation of pregnancy: Secondary | ICD-10-CM

## 2020-10-13 DIAGNOSIS — O43129 Velamentous insertion of umbilical cord, unspecified trimester: Secondary | ICD-10-CM

## 2020-10-13 DIAGNOSIS — Z6841 Body Mass Index (BMI) 40.0 and over, adult: Secondary | ICD-10-CM

## 2020-10-13 DIAGNOSIS — Z8639 Personal history of other endocrine, nutritional and metabolic disease: Secondary | ICD-10-CM

## 2020-10-13 NOTE — Progress Notes (Signed)
     PRENATAL VISIT NOTE  Subjective:  Brittney Salazar is a 35 y.o. G1P0000 at [redacted]w[redacted]d being seen today for ongoing prenatal care.  She is currently monitored for the following issues for this high-risk pregnancy and has Infertility associated with anovulation; Polycystic ovarian syndrome; Elevated testosterone level in female; History of herpes genitalis; Vitamin D deficiency; Hirsutism; Supervision of high risk pregnancy, antepartum; BMI 40.0-44.9, adult (HCC); Maternal morbid obesity, antepartum (HCC); Alpha thalassemia silent carrier; History of ectopic pregnancy; IUI Pregnancy; Velamentous insertion of umbilical cord, antepartum; History of hypothyroidism; and Multigravida of advanced maternal age in third trimester on their problem list.  Patient reports no complaints.  Contractions: Not present. Vag. Bleeding: None.  Movement: Present. Denies leaking of fluid.   The following portions of the patient's history were reviewed and updated as appropriate: allergies, current medications, past family history, past medical history, past social history, past surgical history and problem list.   Objective:   Vitals:   10/13/20 0828  BP: 114/71  Pulse: 73  Weight: 271 lb (122.9 kg)    Fetal Status: Fetal Heart Rate (bpm): 132   Movement: Present     General:  Alert, oriented and cooperative. Patient is in no acute distress.  Skin: Skin is warm and dry. No rash noted.   Cardiovascular: Normal heart rate noted  Respiratory: Normal respiratory effort, no problems with respiration noted  Abdomen: Soft, gravid, appropriate for gestational age.  Pain/Pressure: Absent     Pelvic: Cervical exam deferred        Extremities: Normal range of motion.  Edema: Trace  Mental Status: Normal mood and affect. Normal behavior. Normal judgment and thought content.   Assessment and Plan:  Pregnancy: G1P0000 at [redacted]w[redacted]d 1. [redacted] weeks gestation of pregnancy Continue low dose ASA Interested in Systems developer. Will have  future appt set up with CNM - Glucose Tolerance, 2 Hours w/1 Hour - CBC - RPR - HIV Antibody (routine testing w rflx)  2. History of ectopic pregnancy  3. [redacted] weeks gestation of pregnancy  4. BMI 40.0-44.9, adult (HCC) 11 lbs since last visit, 31 lbs TWG. D/w her to watch weight and aim for 280lbs for end of pregnancy  5. Maternal morbid obesity, antepartum (HCC)  6. History of herpes genitalis Start ppx 34-36wks  7. Supervision of high risk pregnancy, antepartum  8. Velamentous insertion of umbilical cord, antepartum Has 7/19 rpt u/s. I d/w her re: regular growth u/s and recommend IOL at 39wks. 6/21: afi 17, 52%, 1217gm, ac 53%  9. IUI Pregnancy  10. History of hypothyroidism H/o normal TSH. Pt on no meds - TSH  11. AMA (advanced maternal age) primigravida 38+, third trimester  Preterm labor symptoms and general obstetric precautions including but not limited to vaginal bleeding, contractions, leaking of fluid and fetal movement were reviewed in detail with the patient. Please refer to After Visit Summary for other counseling recommendations.   No follow-ups on file.  Future Appointments  Date Time Provider Department Center  11/08/2020  9:00 AM [facility] NURSE [facility]  11/08/2020  9:15 AM [facility]2 [facility]    [place] Bing, MD

## 2020-10-14 LAB — GLUCOSE TOLERANCE, 2 HOURS W/ 1HR
Glucose, 1 hour: 127 mg/dL (ref 65–179)
Glucose, 2 hour: 93 mg/dL (ref 65–152)
Glucose, Fasting: 84 mg/dL (ref 65–91)

## 2020-10-14 LAB — TSH: TSH: 3.24 u[IU]/mL (ref 0.450–4.500)

## 2020-10-15 LAB — CBC
Hematocrit: 32.8 % — ABNORMAL LOW (ref 34.0–46.6)
Hemoglobin: 10.8 g/dL — ABNORMAL LOW (ref 11.1–15.9)
MCH: 28.7 pg (ref 26.6–33.0)
MCHC: 32.9 g/dL (ref 31.5–35.7)
MCV: 87 fL (ref 79–97)
Platelets: 152 10*3/uL (ref 150–450)
RBC: 3.76 x10E6/uL — ABNORMAL LOW (ref 3.77–5.28)
RDW: 12.9 % (ref 11.7–15.4)
WBC: 6.6 10*3/uL (ref 3.4–10.8)

## 2020-10-15 LAB — HIV ANTIBODY (ROUTINE TESTING W REFLEX): HIV Screen 4th Generation wRfx: NONREACTIVE

## 2020-10-15 LAB — RPR: RPR Ser Ql: NONREACTIVE

## 2020-10-17 MED ORDER — FERROUS SULFATE 325 (65 FE) MG PO TABS: 325.0000 mg | ORAL_TABLET | ORAL | 0 refills | Status: DC

## 2020-10-17 MED ORDER — DOCUSATE SODIUM 100 MG PO CAPS: 100.0000 mg | ORAL_CAPSULE | Freq: Two times a day (BID) | ORAL | 0 refills | Status: DC | PRN

## 2020-10-17 NOTE — Addendum Note (Signed)
Addended by: Rosebud Bing on: 10/17/2020 09:32 AM   Modules accepted: Orders

## 2020-10-21 ENCOUNTER — Observation Stay (HOSPITAL_COMMUNITY)
Admission: AD | Admit: 2020-10-21 | Discharge: 2020-10-22 | Disposition: A | Discharge status: Home / Self Care | Payer: 59 | Attending: Obstetrics & Gynecology | Admitting: Obstetrics & Gynecology

## 2020-10-21 ENCOUNTER — Encounter (HOSPITAL_COMMUNITY): Payer: Self-pay | Admitting: Family Medicine

## 2020-10-21 ENCOUNTER — Inpatient Hospital Stay (HOSPITAL_BASED_OUTPATIENT_CLINIC_OR_DEPARTMENT_OTHER): Payer: 59

## 2020-10-21 DIAGNOSIS — O09523 Supervision of elderly multigravida, third trimester: Secondary | ICD-10-CM | POA: Diagnosis not present

## 2020-10-21 DIAGNOSIS — Z3A29 29 weeks gestation of pregnancy: Secondary | ICD-10-CM

## 2020-10-21 DIAGNOSIS — E039 Hypothyroidism, unspecified: Secondary | ICD-10-CM

## 2020-10-21 DIAGNOSIS — O4693 Antepartum hemorrhage, unspecified, third trimester: Secondary | ICD-10-CM | POA: Diagnosis not present

## 2020-10-21 DIAGNOSIS — Z7982 Long term (current) use of aspirin: Secondary | ICD-10-CM | POA: Insufficient documentation

## 2020-10-21 DIAGNOSIS — Z79899 Other long term (current) drug therapy: Secondary | ICD-10-CM | POA: Diagnosis not present

## 2020-10-21 DIAGNOSIS — Z20822 Contact with and (suspected) exposure to covid-19: Secondary | ICD-10-CM | POA: Insufficient documentation

## 2020-10-21 DIAGNOSIS — O43123 Velamentous insertion of umbilical cord, third trimester: Secondary | ICD-10-CM | POA: Insufficient documentation

## 2020-10-21 DIAGNOSIS — Z148 Genetic carrier of other disease: Secondary | ICD-10-CM

## 2020-10-21 DIAGNOSIS — O99283 Endocrine, nutritional and metabolic diseases complicating pregnancy, third trimester: Secondary | ICD-10-CM

## 2020-10-21 DIAGNOSIS — Z87891 Personal history of nicotine dependence: Secondary | ICD-10-CM | POA: Insufficient documentation

## 2020-10-21 DIAGNOSIS — O321XX0 Maternal care for breech presentation, not applicable or unspecified: Secondary | ICD-10-CM

## 2020-10-21 DIAGNOSIS — O0990 Supervision of high risk pregnancy, unspecified, unspecified trimester: Secondary | ICD-10-CM

## 2020-10-21 LAB — URINALYSIS, ROUTINE W REFLEX MICROSCOPIC
Bilirubin Urine: NEGATIVE
Glucose, UA: NEGATIVE mg/dL
Hgb urine dipstick: NEGATIVE
Ketones, ur: NEGATIVE mg/dL
Leukocytes,Ua: NEGATIVE
Nitrite: NEGATIVE
Protein, ur: NEGATIVE mg/dL
Specific Gravity, Urine: 1.027 (ref 1.005–1.030)
pH: 6 (ref 5.0–8.0)

## 2020-10-21 LAB — WET PREP, GENITAL
Sperm: NONE SEEN
Trich, Wet Prep: NONE SEEN
Yeast Wet Prep HPF POC: NONE SEEN

## 2020-10-21 NOTE — MAU Note (Signed)
Today went to [facility] and had some menstrual like blood in panties and passed some blood clots.Saw the blood at 1500. At 1800 spotting was pink but no more clots. Some pelvic pressure but not as much as yesterday when it was very painful to walk.

## 2020-10-21 NOTE — MAU Note (Signed)
Baby very active-difficult maintaining continuous tracing

## 2020-10-22 ENCOUNTER — Encounter (HOSPITAL_COMMUNITY): Payer: Self-pay | Admitting: Family Medicine

## 2020-10-22 ENCOUNTER — Other Ambulatory Visit: Payer: Self-pay

## 2020-10-22 ENCOUNTER — Encounter: Payer: Self-pay | Admitting: Obstetrics & Gynecology

## 2020-10-22 DIAGNOSIS — O4693 Antepartum hemorrhage, unspecified, third trimester: Principal | ICD-10-CM

## 2020-10-22 DIAGNOSIS — Z3A29 29 weeks gestation of pregnancy: Secondary | ICD-10-CM | POA: Diagnosis not present

## 2020-10-22 LAB — RESP PANEL BY RT-PCR (FLU A&B, COVID) ARPGX2
Influenza A by PCR: NEGATIVE
Influenza B by PCR: NEGATIVE
SARS Coronavirus 2 by RT PCR: NEGATIVE

## 2020-10-22 LAB — CBC
HCT: 34.2 % — ABNORMAL LOW (ref 36.0–46.0)
Hemoglobin: 11.1 g/dL — ABNORMAL LOW (ref 12.0–15.0)
MCH: 28.7 pg (ref 26.0–34.0)
MCHC: 32.5 g/dL (ref 30.0–36.0)
MCV: 88.4 fL (ref 80.0–100.0)
Platelets: 169 10*3/uL (ref 150–400)
RBC: 3.87 MIL/uL (ref 3.87–5.11)
RDW: 13.2 % (ref 11.5–15.5)
WBC: 7.3 10*3/uL (ref 4.0–10.5)
nRBC: 0 % (ref 0.0–0.2)

## 2020-10-22 LAB — TYPE AND SCREEN
ABO/RH(D): O POS
Antibody Screen: NEGATIVE

## 2020-10-22 MED ORDER — SODIUM CHLORIDE 0.9% FLUSH: 3.0000 mL | Freq: Two times a day (BID) | INTRAVENOUS | Status: DC

## 2020-10-22 MED ORDER — LEVOTHYROXINE SODIUM 25 MCG PO TABS
100.0000 ug | ORAL_TABLET | ORAL | Status: DC
  Administered 2020-10-22: 100 ug via ORAL
  Filled 2020-10-22: qty 4

## 2020-10-22 MED ORDER — FERROUS SULFATE 325 (65 FE) MG PO TABS
325.0000 mg | ORAL_TABLET | ORAL | Status: DC
  Administered 2020-10-22: 325 mg via ORAL
  Filled 2020-10-22: qty 1

## 2020-10-22 MED ORDER — DOCUSATE SODIUM 100 MG PO CAPS
100.0000 mg | ORAL_CAPSULE | Freq: Every day | ORAL | Status: DC
  Administered 2020-10-22: 100 mg via ORAL
  Filled 2020-10-22: qty 1

## 2020-10-22 MED ORDER — LEVOTHYROXINE SODIUM 25 MCG PO TABS: 50.0000 ug | ORAL_TABLET | ORAL | Status: DC

## 2020-10-22 MED ORDER — SODIUM CHLORIDE 0.9% FLUSH: 3.0000 mL | INTRAVENOUS | Status: DC | PRN

## 2020-10-22 MED ORDER — ZOLPIDEM TARTRATE 5 MG PO TABS: 5.0000 mg | ORAL_TABLET | Freq: Every evening | ORAL | Status: DC | PRN

## 2020-10-22 MED ORDER — CALCIUM CARBONATE ANTACID 500 MG PO CHEW: 2.0000 | CHEWABLE_TABLET | ORAL | Status: DC | PRN

## 2020-10-22 MED ORDER — ASPIRIN EC 81 MG PO TBEC
81.0000 mg | DELAYED_RELEASE_TABLET | Freq: Every day | ORAL | Status: DC
  Administered 2020-10-22: 81 mg via ORAL
  Filled 2020-10-22: qty 1

## 2020-10-22 MED ORDER — PRENATAL MULTIVITAMIN CH
1.0000 | ORAL_TABLET | Freq: Every day | ORAL | Status: DC
  Administered 2020-10-22: 1 via ORAL
  Filled 2020-10-22: qty 1

## 2020-10-22 MED ORDER — SODIUM CHLORIDE 0.9 % IV SOLN: 250.0000 mL | INTRAVENOUS | Status: DC | PRN

## 2020-10-22 MED ORDER — ACETAMINOPHEN 325 MG PO TABS: 650.0000 mg | ORAL_TABLET | ORAL | Status: DC | PRN

## 2020-10-22 MED ORDER — LEVOTHYROXINE SODIUM 50 MCG PO TABS: 50.0000 ug | ORAL_TABLET | Freq: Every day | ORAL | Status: DC

## 2020-10-22 NOTE — MAU Note (Signed)
Dr.Pratt called with report of increased uterine activity. Pt denies feeling painful or regular contractions. No further discharge, bleeding or bloody show. Ok'd to transfer to [facility].

## 2020-10-22 NOTE — H&P (Signed)
OBSTETRIC ADMISSION HISTORY AND PHYSICAL  Brittney Salazar is a 35 y.o. female G1P0000 with IUP at [redacted]w[redacted]d by IUI presenting for vaginal bleeding. She reports that she was at work this afternoon and noticed a large amount of menstrual like blood on the toilet paper. She reports that she also passed multiple dime-sized clots. Since this incident of bleeding, she has only had some spotting. She reports intermittent pelvic pain when she closes her legs and occasional tightness in abdomen. She denies pain at this time. Denies vaginal itching, irritation, odor, or urinary s/s. Endorses active fetal movement. She has not had intercourse or any recent vaginal exams.  She received her prenatal care at  [facility]    Dating: By IUI date --->  Estimated Date of Delivery: 01/03/21  Sono:    @[redacted]w[redacted]d , CWD, normal anatomy, cephalic presentation, 307gg, EFW   Prenatal History/Complications:  - IUI - Velamentous cord insertion - BMI 40.0-49.9 - History HSV - History of hypothyroidism - Silent alpha thalassemia carrier  Past Medical History: Past Medical History:  Diagnosis Date   Hx of metrorrhagia    Left tubal pregnancy with intrauterine pregnancy 05/18/2020   S/p left salpingectomy    Past Surgical History: Past Surgical History:  Procedure Laterality Date   SALPINGECTOMY Left    WISDOM TOOTH EXTRACTION      Obstetrical History: OB History     Gravida  1   Para  0   Term  0   Preterm  0   AB  0   Living         SAB  0   IAB  0   Ectopic  0   Multiple      Live Births              Social History Social History   Socioeconomic History   Marital status: Married    Spouse name: Not on file   Number of children: Not on file   Years of education: Not on file   Highest education level: Not on file  Occupational History   Not on file  Tobacco Use   Smoking status: Former    Packs/day: 0.25    Pack years: 0.00    Types: Cigarettes   Smokeless tobacco:  Never  Vaping Use   Vaping Use: Never used  Substance and Sexual Activity   Alcohol use: No   Drug use: No   Sexual activity: Yes    Birth control/protection: None  Other Topics Concern   Not on file  Social History Narrative   ** Merged History Encounter **       Social Determinants of Health   Financial Resource Strain: Not on file  Food Insecurity: Not on file  Transportation Needs: Not on file  Physical Activity: Not on file  Stress: Not on file  Social Connections: Not on file    Family History: Family History  Problem Relation Age of Onset   Hypertension Mother    Arthritis Father     Allergies: Allergies  Allergen Reactions   Ciprofloxacin Hives    Medications Prior to Admission  Medication Sig Dispense Refill Last Dose   aspirin EC 81 MG tablet Take 1 tablet (81 mg total) by mouth daily. Take after 12 weeks for prevention of preeclampsia later in pregnancy 300 tablet 2 10/21/2020   docusate sodium (COLACE) 100 MG capsule Take 1 capsule (100 mg total) by mouth 2 (two) times daily as needed. 40 capsule 0  10/20/2020   ferrous sulfate (FERROUSUL) 325 (65 FE) MG tablet Take 1 tablet (325 mg total) by mouth every other day. 50 tablet 0 10/21/2020   levothyroxine (SYNTHROID) 50 MCG tablet Take 50 mcg by mouth daily before breakfast.   10/21/2020   prenatal vitamin w/FE, FA (PRENATAL 1 + 1) 27-1 MG TABS tablet Take 1 tablet by mouth daily at 12 noon.   10/21/2020    Review of Systems   All systems reviewed and negative except as stated in HPI  Blood pressure 124/64, pulse 76, temperature (!) 97.4 F (36.3 C), resp. rate 18, height 5\' 4"  (1.626 m), weight 123.8 kg, SpO2 99 %. General appearance: alert, cooperative, and no distress Lungs: normal effort Heart: regular rate Abdomen: soft, non-tender; gravid Pelvic: NEFG, small amount of dark red blood coming through cervical os, cervix without lesions/masses, visually closed, no CMT Extremities: Homans sign is negative, no  sign of DVT DTR's  Presentation: breech by today's ultrasound Fetal monitoring: 135bpm, moderate variability, +accels, no decels Uterine activity: toco quiet Dilation: Closed Exam by:: [email or web address] CNM   Prenatal labs: ABO, Rh: O/Positive/-- (02/23 0949) Antibody: Negative (02/23 0949) Rubella: 2.70 (02/23 0949) RPR: Non Reactive (06/23 0905)  HBsAg: Negative (02/23 0949)  HIV: Non Reactive (06/23 0905)  GBS:  unknown  2 hr Glucola 84/127/93 Genetic screening: LR NIPS Anatomy 03-28-1982: normal  Prenatal Transfer Tool  Maternal Diabetes: No Genetic Screening: Normal Maternal Ultrasounds/Referrals: Normal Fetal Ultrasounds or other Referrals:  None Maternal Substance Abuse:  No Significant Maternal Medications:  None Significant Maternal Lab Results: None  Results for orders placed or performed during the hospital encounter of 10/21/20 (from the past 24 hour(s))  Urinalysis, Routine w reflex microscopic Urine, Clean Catch   Collection Time: 10/21/20 10:23 PM  Result Value Ref Range   Color, Urine YELLOW YELLOW   APPearance HAZY (A) CLEAR   Specific Gravity, Urine 1.027 1.005 - 1.030   pH 6.0 5.0 - 8.0   Glucose, UA NEGATIVE NEGATIVE mg/dL   Hgb urine dipstick NEGATIVE NEGATIVE   Bilirubin Urine NEGATIVE NEGATIVE   Ketones, ur NEGATIVE NEGATIVE mg/dL   Protein, ur NEGATIVE NEGATIVE mg/dL   Nitrite NEGATIVE NEGATIVE   Leukocytes,Ua NEGATIVE NEGATIVE  Wet prep, genital   Collection Time: 10/21/20 11:15 PM   Specimen: PATH Cytology Cervicovaginal Ancillary Only  Result Value Ref Range   Yeast Wet Prep HPF POC NONE SEEN NONE SEEN   Trich, Wet Prep NONE SEEN NONE SEEN   Clue Cells Wet Prep HPF POC PRESENT (A) NONE SEEN   WBC, Wet Prep HPF POC MODERATE (A) NONE SEEN   Sperm NONE SEEN     Patient Active Problem List   Diagnosis Date Noted   History of ectopic pregnancy 10/13/2020   IUI Pregnancy 10/13/2020   Velamentous insertion of umbilical cord, antepartum 10/13/2020    History of hypothyroidism 10/13/2020   Multigravida of advanced maternal age in third trimester 10/13/2020   Alpha thalassemia silent carrier 07/01/2020   Supervision of high risk pregnancy, antepartum 06/15/2020   Maternal morbid obesity, antepartum (HCC) 06/15/2020   BMI 40.0-44.9, adult (HCC) 08/09/2019   Infertility associated with anovulation 08/07/2011   Polycystic ovarian syndrome 08/07/2011   Elevated testosterone level in female 08/07/2011   History of herpes genitalis 08/07/2011   Vitamin D deficiency 08/07/2011   Hirsutism 08/07/2011    Assessment/Plan:  SHANTERA MONTS is a 35 y.o. G1P0000 at [redacted]w[redacted]d here for vaginal bleeding.  - Discussed with Dr. [redacted]w[redacted]d  who recommends admission to [facility] for observation given no known cause for vaginal bleeding.  - Dr. Shawnie Pons to place admission orders    Brand Males, CNM  10/22/2020, 1:17 AM

## 2020-10-22 NOTE — MAU Note (Signed)
Increased uterine activity noted since 1 am. Pt reports no change. "I feel some tightening sometimes". CNM notified will speak with  attending prior to transfer to [facility]

## 2020-10-22 NOTE — MAU Note (Signed)
Report called to [facility] charge RN.

## 2020-10-22 NOTE — Discharge Summary (Signed)
Antenatal Physician Discharge Summary  Patient ID: Brittney Salazar MRN: [id number] DOB/AGE: 1985/06/15 34 y.o.  Admit date: 10/21/2020 Discharge date: 10/22/2020  Admission Diagnoses: Vaginal bleeding in third trimester, [redacted] weeks gestation, velamentous cord insertion  Discharge Diagnoses: The same  Prenatal Procedures: NST and ultrasound  Consults: None  Hospital Course:  Brittney Salazar is a 35 y.o. G1P0000 with IUP at [redacted]w[redacted]d admitted  after episode of vaginal bleeding.  History of velamentous cord insertion.  Upon admission, she had dark spotting noted, no active bleeding.  Limited ultrasound did not show any overt abruption or placental anomaly.  She was observed overnight and had no leaking of fluid and no bleeding.  The fetal heart rate monitoring remained reassuring, and she had no signs/symptoms of worsening bleeding, preterm labor or other maternal-fetal concerns.  Patient desired to be discharged to home today. Repeat pelvic exam showed scant light brown discharge, closed cervix.  She was deemed stable for discharge to home with close outpatient follow up. She is scheduled to be seen in office on 10/26/2020, and was given strict precautions to return to [facility] [place] with any bleeding.   Discharge Exam: Temp:  [97.4 F (36.3 C)-98.6 F (37 C)] 98 F (36.7 C) (07/02 1504) Pulse Rate:  [76-88] 85 (07/02 1504) Resp:  [16-18] 18 (07/02 1504) BP: (110-138)/(43-81) 110/43 (07/02 1504) SpO2:  [99 %-100 %] 99 % (07/02 1504) Weight:  [123.8 kg] 123.8 kg (07/02 0219) Physical Examination: CONSTITUTIONAL: Well-developed, well-nourished female in no acute distress.  SKIN: Skin is warm and dry. No rash noted. Not diaphoretic. No erythema. No pallor. NEUROLGIC: Alert and oriented to person, place, and time. Normal reflexes, muscle tone coordination. No cranial nerve deficit noted. PSYCHIATRIC: Normal mood and affect. Normal behavior. Normal judgment and thought content. CARDIOVASCULAR: Normal heart rate  noted, regular rhythm RESPIRATORY: Effort and breath sounds normal, no problems with respiration noted MUSCULOSKELETAL: Normal range of motion. No edema and no tenderness. 2+ distal pulses. ABDOMEN: Soft, nontender, nondistended, gravid. CERVIX: Dilation: Closed Effacement (%): Thick Cervical Position: Posterior Exam by:: Dr. Macon Large Scant brown discharge noted  Fetal monitoring: FHR: 130 bpm, Variability: moderate, Accelerations: Present, Decelerations: Absent  Uterine activity: None  Significant Diagnostic Studies:  Results for orders placed or performed during the hospital encounter of 10/21/20 (from the past 168 hour(s))  Urinalysis, Routine w reflex microscopic Urine, Clean Catch   Collection Time: 10/21/20 10:23 PM  Result Value Ref Range   Color, Urine YELLOW YELLOW   APPearance HAZY (A) CLEAR   Specific Gravity, Urine 1.027 1.005 - 1.030   pH 6.0 5.0 - 8.0   Glucose, UA NEGATIVE NEGATIVE mg/dL   Hgb urine dipstick NEGATIVE NEGATIVE   Bilirubin Urine NEGATIVE NEGATIVE   Ketones, ur NEGATIVE NEGATIVE mg/dL   Protein, ur NEGATIVE NEGATIVE mg/dL   Nitrite NEGATIVE NEGATIVE   Leukocytes,Ua NEGATIVE NEGATIVE  Wet prep, genital   Collection Time: 10/21/20 11:15 PM   Specimen: PATH Cytology Cervicovaginal Ancillary Only  Result Value Ref Range   Yeast Wet Prep HPF POC NONE SEEN NONE SEEN   Trich, Wet Prep NONE SEEN NONE SEEN   Clue Cells Wet Prep HPF POC PRESENT (A) NONE SEEN   WBC, Wet Prep HPF POC MODERATE (A) NONE SEEN   Sperm NONE SEEN   Resp Panel by RT-PCR (Flu A&B, Covid) Nasopharyngeal Swab   Collection Time: 10/22/20 12:55 AM   Specimen: Nasopharyngeal Swab; Nasopharyngeal(NP) swabs in vial transport medium  Result Value Ref Range   SARS Coronavirus  2 by RT PCR NEGATIVE NEGATIVE   Influenza A by PCR NEGATIVE NEGATIVE   Influenza B by PCR NEGATIVE NEGATIVE  CBC on admission   Collection Time: 10/22/20  1:16 AM  Result Value Ref Range   WBC 7.3 4.0 - 10.5 K/uL    RBC 3.87 3.87 - 5.11 MIL/uL   Hemoglobin 11.1 (L) 12.0 - 15.0 g/dL   HCT 16.134.2 (L) 09.636.0 - 04.546.0 %   MCV 88.4 80.0 - 100.0 fL   MCH 28.7 26.0 - 34.0 pg   MCHC 32.5 30.0 - 36.0 g/dL   RDW 40.913.2 81.111.5 - 91.415.5 %   Platelets 169 150 - 400 K/uL   nRBC 0.0 0.0 - 0.2 %  Type and screen [facility]   Collection Time: 10/22/20  1:16 AM  Result Value Ref Range   ABO/RH(D) O POS    Antibody Screen NEG    Sample Expiration      10/25/2020,2359 Performed at [facility], [address] [place], [place] [address]    US MFM OB FOLLOW UP  Result Date: 10/11/2020 ----------------------------------------------------------------------  OBSTETRICS REPORT                       (Signed Final 10/11/2020 10:07 am) ---------------------------------------------------------------------- Patient Info  ID #:       [id number]                          D.O.B.:  1986-01-15 (34 yrs)  Name:       Brittney Salazar                 Visit Date: 10/11/2020 09:29 am ---------------------------------------------------------------------- Performed By  Attending:        Noralee Spaceavi Shankar MD        Secondary Phy.:   NICOLE E                                                             NUGENT  Performed By:     Jeanmarie PlantBreAnna [place]fleet       [facility]  Referred By:  [facility] MAU/Triage ---------------------------------------------------------------------- Orders  #  Description                           Code        Ordered By  1  [place] MFM OB FOLLOW UP                   [id number]    YU FANG ----------------------------------------------------------------------  #  Order #                     Accession #                Episode #  1  [id number]                   [id number]                 [id number]  ---------------------------------------------------------------------- Indications  [redacted] weeks gestation of pregnancy                Z3A.57  Advanced maternal age multigravida 80+,        O45.522  second trimester (34 yrs)  Hypothyroid                                    O99.280 E03.9  Low Risk NIPS  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 Location:         Intrauterine  Fetal Heart Rate(bpm):  132  Cardiac Activity:       Observed  Presentation:           Breech  Placenta:               Fundal  P. Cord Insertion:      Velamentous insertion (prev)  Amniotic Fluid  AFI FV:      Within normal limits  AFI Sum(cm)     %Tile       Largest Pocket(cm)  17.7            67          6.  RUQ(cm)       RLQ(cm)       LUQ(cm)        LLQ(cm)  3.11          3.98          6              4.61 ---------------------------------------------------------------------- Biometry  BPD:      72.6  mm     G. Age:  29w 1d         75  %    CI:        69.96   %    70 - 86                                                          FL/HC:      18.8   %    18.8 - 20.6  HC:      276.9  mm     G. Age:  30w 2d         86  %    HC/AC:      1.15        1.05 - 1.21  AC:      240.6  mm     G. Age:  28w 2d         53  %    FL/BPD:     71.6   %    71 - 87  FL:         52  mm     G. Age:  27w 5d         27  %    FL/AC:      21.6   %    20 - 24  HUM:      49.7  mm     G. Age:  29w 1d         69  %  LV:        4.3  mm  Est. FW:    1217  gm    2 lb 11 oz      52  % ---------------------------------------------------------------------- OB History  Gravidity:    1 ---------------------------------------------------------------------- Gestational Age  Clinical EDD:  28w 0d                                        EDD:   01/03/21  U/S Today:     28w 6d                                         EDD:   12/28/20  Best:          28w 0d     Det. By:  Clinical EDD             EDD:   01/03/21 ---------------------------------------------------------------------- Anatomy  Cranium:               Appears normal         Aortic Arch:            Previously seen  Cavum:                 Appears normal         Ductal Arch:            Previously seen  Ventricles:            Appears normal         Diaphragm:              Appears normal  Choroid Plexus:        Previously seen        Stomach:                Appears normal, left  sided  Cerebellum:            Previously seen        Abdomen:                Previously seen  Posterior Fossa:       Previously seen        Abdominal Wall:         Previously seen  Nuchal Fold:           Previously seen        Cord Vessels:           Previously seen  Face:                  previously seen        Kidneys:                Appear normal  Lips:                  Previously seen        Bladder:                Appears normal  Thoracic:              Previously seen        Spine:                  Previously seen  Heart:                 Previously seen        Upper Extremities:      Previously seen  RVOT:                  Previously seen        Lower Extremities:      Previously seen  LVOT:                  Previously seen  Other:  Fetus appears to be a female. Heels previously visualized. Technically          difficult due to maternal habitus and fetal position. ---------------------------------------------------------------------- Cervix Uterus Adnexa  Cervix  Not visualized (advanced GA >24wks)  Uterus  No abnormality visualized.  Right Ovary  Not visualized.  Left Ovary  Not visualized.  Cul De Sac  No free fluid seen.  Adnexa  No abnormality visualized. ---------------------------------------------------------------------- Impression  Velamentous cord insertion.  Patient returned for fetal growth  assessment.  Fetal  growth is appropriate for gestational age.  Amniotic fluid  is normal and good fetal activity seen.  Patient has a diagnosis of hypothyroidism but her most  recent TSH levels are within normal range. ---------------------------------------------------------------------- Recommendations  -An appointment was made for her to return in 4 weeks for  fetal growth assessment and to reassess placental cord  insertion. ----------------------------------------------------------------------                  Noralee Space, MD Electronically Signed Final Report   10/11/2020 10:07 am ----------------------------------------------------------------------  [place] MFM OB LIMITED  Result Date: 10/22/2020 ----------------------------------------------------------------------  OBSTETRICS REPORT                         (Signed Final 10/22/2020 11:24 am) ---------------------------------------------------------------------- Patient Info  ID #:        [id number]  D.O.B.:  1986-03-13 (34 yrs)  Name:        LASHAYA KIENITZ                 Visit Date: 10/21/2020 11:40 pm ---------------------------------------------------------------------- Performed By  Attending:         Ma Rings MD         Secondary Phy.:    NICOLE E NUGENT  Performed By:      Percell Boston          [facility]  Referred By:       [facility] MAU/Triage ---------------------------------------------------------------------- Orders  #   Description                          Code         Ordered By  1   [place] MFM OB LIMITED                    99242.68     Camelia Eng ----------------------------------------------------------------------  #   Order #                    Accession #                 Episode #  1   [id number]                  [id number]                  [id number] ---------------------------------------------------------------------- Indications   Vaginal bleeding in pregnancy, third trimester  O46.93  [redacted] weeks gestation of pregnancy                 Z3A.47  Advanced maternal age multigravida 38+,         O68.522  second trimester (34 yrs)  Hypothyroid                                     O99.280 E03.9  Low Risk NIPS  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 Cord Insertion:       Velamentous insertion(prev)  Amniotic Fluid  AFI FV:      Within normal limits  AFI Sum(cm)     %Tile       Largest Pocket(cm)  10.5            17          4.6  RUQ(cm)  RLQ(cm)        LUQ(cm)        LLQ(cm)  3.3           4.6            2.6            0 ---------------------------------------------------------------------- OB History  Gravidity:     1 ---------------------------------------------------------------------- Gestational Age  Clinical EDD:   29w 3d                                         EDD:  01/03/21  Best:           29w 3d    Det. By:  Clinical EDD               EDD:  01/03/21 ---------------------------------------------------------------------- Anatomy  Thoracic:               Appears normal         Bladder:                Appears normal  Stomach:                Appears normal, left                          sided ---------------------------------------------------------------------- Cervix Uterus Adnexa  Cervix  Not visualized (advanced GA >24wks)  Uterus  No abnormality visualized.  Right Ovary  No adnexal mass visualized.  Left Ovary  No adnexal mass visualized.  Cul De Sac  No free fluid seen.  Adnexa  No abnormality visualized. ---------------------------------------------------------------------- Comments  This patient presented to the MAU due  to vaginal bleeding.  A limited ultrasound performed today shows that the fetus is in  the breech presentation.  There was normal amniotic fluid noted.  A normal appearing fundal placenta is noted. ----------------------------------------------------------------------                    Ma Rings, MD Electronically Signed Final Report   10/22/2020 11:24 am ----------------------------------------------------------------------   Future Appointments  Date Time Provider Department Center  10/26/2020  9:10 AM Calvert Cantor, CNM [facility]  11/08/2020  9:00 AM [facility] NURSE [facility]  11/08/2020  9:15 AM [facility] US2 [facility]    Discharge Condition: Stable  Discharge disposition: 01-Home or Self Care        Allergies as of 10/22/2020       Reactions   Ciprofloxacin Hives        Medication List     TAKE these medications    aspirin EC 81 MG tablet Take 1 tablet (81 mg total) by mouth daily. Take after 12 weeks for prevention of preeclampsia later in pregnancy   docusate sodium 100 MG capsule Commonly known as: COLACE Take 1 capsule (100 mg total) by mouth 2 (two) times daily as needed.   ferrous sulfate 325 (65 FE) MG tablet Commonly known as: FerrouSul Take 1 tablet (325 mg total) by mouth every other day.   levothyroxine 50 MCG tablet Commonly known as: SYNTHROID Take 50 mcg by mouth daily before breakfast.   prenatal vitamin w/FE, FA 27-1 MG Tabs tablet Take 1 tablet by mouth daily at 12 noon.         Total discharge time: 20 minutes   Signed: Jaynie Collins M.D. 10/22/2020, 3:45  PM

## 2020-10-25 LAB — GC/CHLAMYDIA PROBE AMP (~~LOC~~) NOT AT ARMC
Chlamydia: NEGATIVE
Comment: NEGATIVE
Comment: NORMAL
Neisseria Gonorrhea: NEGATIVE

## 2020-10-26 ENCOUNTER — Other Ambulatory Visit: Payer: Self-pay

## 2020-10-26 ENCOUNTER — Encounter: Payer: Self-pay | Admitting: Radiology

## 2020-10-26 ENCOUNTER — Ambulatory Visit (INDEPENDENT_AMBULATORY_CARE_PROVIDER_SITE_OTHER): Payer: 59 | Admitting: Advanced Practice Midwife

## 2020-10-26 VITALS — BP 122/74 | HR 87 | Wt 274.0 lb

## 2020-10-26 DIAGNOSIS — Z3A30 30 weeks gestation of pregnancy: Secondary | ICD-10-CM

## 2020-10-26 DIAGNOSIS — O0990 Supervision of high risk pregnancy, unspecified, unspecified trimester: Secondary | ICD-10-CM

## 2020-10-26 DIAGNOSIS — O4693 Antepartum hemorrhage, unspecified, third trimester: Secondary | ICD-10-CM

## 2020-10-26 DIAGNOSIS — O99210 Obesity complicating pregnancy, unspecified trimester: Secondary | ICD-10-CM

## 2020-10-26 DIAGNOSIS — O43129 Velamentous insertion of umbilical cord, unspecified trimester: Secondary | ICD-10-CM

## 2020-10-26 NOTE — Progress Notes (Addendum)
   PRENATAL VISIT NOTE  Subjective:  Brittney Salazar is a 35 y.o. G1P0000 at [redacted]w[redacted]d being seen today for ongoing prenatal care.  She is currently monitored for the following issues for this high-risk pregnancy and has Infertility associated with anovulation; Polycystic ovarian syndrome; Elevated testosterone level in female; History of herpes genitalis; Vitamin D deficiency; Hirsutism; Supervision of high risk pregnancy, antepartum; BMI 40.0-44.9, adult (HCC); Maternal morbid obesity, antepartum (HCC); Alpha thalassemia silent carrier; History of ectopic pregnancy; IUI Pregnancy; Velamentous insertion of umbilical cord, antepartum; History of hypothyroidism; Multigravida of advanced maternal age in third trimester; and Third trimester bleeding, antepartum on their problem list.  Patient reports no complaints.  Contractions: Irritability. Vag. Bleeding: None.  Movement: Absent. Denies leaking of fluid.   The following portions of the patient's history were reviewed and updated as appropriate: allergies, current medications, past family history, past medical history, past social history, past surgical history and problem list. Problem list updated.  Objective:   Vitals:   10/26/20 0932  BP: 122/74  Pulse: 87  Weight: 274 lb (124.3 kg)    Fetal Status: Fetal Heart Rate (bpm): 130   Movement: Absent     General:  Alert, oriented and cooperative. Patient is in no acute distress.  Skin: Skin is warm and dry. No rash noted.   Cardiovascular: Normal heart rate noted  Respiratory: Normal respiratory effort, no problems with respiration noted  Abdomen: Soft, gravid, appropriate for gestational age.  Pain/Pressure: Absent     Pelvic: Cervical exam deferred        Extremities: Normal range of motion.  Edema: None  Mental Status: Normal mood and affect. Normal behavior. Normal judgment and thought content.   Assessment and Plan:  Pregnancy: G1P0000 at [redacted]w[redacted]d  1. Supervision of high risk pregnancy,  antepartum - Routine care, no complaints or concerns during visit  2. Vaginal bleeding in pregnancy, third trimester - Two episodes of heavy bleeding this pregnancy - Ante admission for recent bleeding 10/22/2020  3. Velamentous insertion of umbilical cord, antepartum - Ongoing MFM surveillance  4. Maternal morbid obesity, antepartum (HCC) - TWG 34 lbs - EFW 52% as of 10/11/2020  5. [redacted] weeks gestation of pregnancy - Continue kick counts as advised during ante admission - Reviewed interventions for low kick number, indications for MAU evaluation  Given bleeding of unknown origin and velamentous cord insertion, patient is not a candidate for watebirth. Confirmed with [facility] CNMs today  Preterm labor symptoms and general obstetric precautions including but not limited to vaginal bleeding, contractions, leaking of fluid and fetal movement were reviewed in detail with the patient. Please refer to After Visit Summary for other counseling recommendations.  Return in about 2 weeks (around 11/09/2020).  Future Appointments  Date Time Provider Department Center  11/08/2020  9:00 AM [facility] NURSE [facility]  11/08/2020  9:15 AM [facility] US2 [facility]  11/10/2020  1:15 PM Anyanwu, Jethro Bastos, MD [facility]  11/24/2020  8:55 AM Anyanwu, Jethro Bastos, MD [facility]  12/08/2020  8:35 AM Reva Bores, MD [facility]    Calvert Cantor, CNM

## 2020-11-08 ENCOUNTER — Other Ambulatory Visit: Payer: Self-pay

## 2020-11-08 ENCOUNTER — Encounter: Payer: Self-pay | Admitting: *Deleted

## 2020-11-08 ENCOUNTER — Other Ambulatory Visit: Payer: Self-pay | Admitting: *Deleted

## 2020-11-08 ENCOUNTER — Ambulatory Visit: Payer: 59 | Attending: Obstetrics and Gynecology

## 2020-11-08 ENCOUNTER — Ambulatory Visit: Payer: 59 | Admitting: *Deleted

## 2020-11-08 VITALS — BP 110/77 | HR 108

## 2020-11-08 DIAGNOSIS — O09513 Supervision of elderly primigravida, third trimester: Secondary | ICD-10-CM

## 2020-11-08 DIAGNOSIS — O0990 Supervision of high risk pregnancy, unspecified, unspecified trimester: Secondary | ICD-10-CM

## 2020-11-08 DIAGNOSIS — E039 Hypothyroidism, unspecified: Secondary | ICD-10-CM

## 2020-11-08 DIAGNOSIS — O321XX0 Maternal care for breech presentation, not applicable or unspecified: Secondary | ICD-10-CM

## 2020-11-08 DIAGNOSIS — O43123 Velamentous insertion of umbilical cord, third trimester: Secondary | ICD-10-CM | POA: Diagnosis not present

## 2020-11-08 DIAGNOSIS — O4693 Antepartum hemorrhage, unspecified, third trimester: Secondary | ICD-10-CM

## 2020-11-08 DIAGNOSIS — Z148 Genetic carrier of other disease: Secondary | ICD-10-CM

## 2020-11-08 DIAGNOSIS — O99283 Endocrine, nutritional and metabolic diseases complicating pregnancy, third trimester: Secondary | ICD-10-CM | POA: Diagnosis not present

## 2020-11-08 DIAGNOSIS — Z3A32 32 weeks gestation of pregnancy: Secondary | ICD-10-CM

## 2020-11-10 ENCOUNTER — Encounter: Payer: Self-pay | Admitting: Obstetrics & Gynecology

## 2020-11-10 ENCOUNTER — Telehealth (INDEPENDENT_AMBULATORY_CARE_PROVIDER_SITE_OTHER): Payer: 59 | Admitting: Obstetrics & Gynecology

## 2020-11-10 DIAGNOSIS — O43129 Velamentous insertion of umbilical cord, unspecified trimester: Secondary | ICD-10-CM

## 2020-11-10 DIAGNOSIS — O43123 Velamentous insertion of umbilical cord, third trimester: Secondary | ICD-10-CM

## 2020-11-10 DIAGNOSIS — O0990 Supervision of high risk pregnancy, unspecified, unspecified trimester: Secondary | ICD-10-CM

## 2020-11-10 DIAGNOSIS — Z3A32 32 weeks gestation of pregnancy: Secondary | ICD-10-CM

## 2020-11-10 DIAGNOSIS — U071 COVID-19: Secondary | ICD-10-CM | POA: Insufficient documentation

## 2020-11-10 DIAGNOSIS — O98513 Other viral diseases complicating pregnancy, third trimester: Secondary | ICD-10-CM

## 2020-11-10 NOTE — Progress Notes (Signed)
OBSTETRICS PRENATAL VIRTUAL VISIT ENCOUNTER NOTE  Provider location: [facility] for [facility]   Patient location: Home  I connected with Brittney Salazar on 11/10/20 at  1:15 PM EDT by MyChart Video Encounter and verified that I am speaking with the correct person using two identifiers. I discussed the limitations, risks, security and privacy concerns of performing an evaluation and management service virtually and the availability of in person appointments. I also discussed with the patient that there may be a patient responsible charge related to this service. The patient expressed understanding and agreed to proceed. Subjective:  Brittney Salazar is a 35 y.o. G1P0 at [redacted]w[redacted]d being seen today for ongoing prenatal care.  She is currently monitored for the following issues for this high-risk pregnancy and has Infertility associated with anovulation; Polycystic ovarian syndrome; Elevated testosterone level in female; History of herpes genitalis; Vitamin D deficiency; Hirsutism; Supervision of high risk pregnancy, antepartum; BMI 40.0-44.9, adult (HCC); Maternal morbid obesity, antepartum (HCC); Alpha thalassemia silent carrier; History of ectopic pregnancy; IUI Pregnancy; Velamentous insertion of umbilical cord, antepartum; History of hypothyroidism; Multigravida of advanced maternal age in third trimester; Third trimester bleeding, antepartum; and COVID-19 affecting pregnancy in third trimester on their problem list.  Patient reports testing positive for COVID. Symptoms started 7/17, test positive 7/20. Reports sore throat, nasal congestion, mild fevers, no respiratory distress.  Contractions: Not present. Vag. Bleeding: None.  Movement: Present. Denies any leaking of fluid.   The following portions of the patient's history were reviewed and updated as appropriate: allergies, current medications, past family history, past medical history, past social history, past surgical history  and problem list.   Objective:  Normal BP at MFM during 7/19/222 visit.  Fetal Status:     Movement: Present     General:  Alert, oriented and cooperative. Patient is in no acute distress.  Respiratory: Normal respiratory effort, no problems with respiration noted  Mental Status: Normal mood and affect. Normal behavior. Normal judgment and thought content.  Rest of physical exam deferred due to type of encounter  Imaging: [place] MFM OB FOLLOW UP  Result Date: 11/08/2020 ----------------------------------------------------------------------  OBSTETRICS REPORT                       (Signed Final 11/08/2020 10:04 am) ---------------------------------------------------------------------- Patient Info  ID #:       [id number]                          D.O.B.:  July 21, 1985 (35 yrs)  Name:       Brittney Salazar                 Visit Date: 11/08/2020 08:56 am ---------------------------------------------------------------------- Performed By  Attending:        Lin Landsman      Secondary Phy.:   Odie Sera                    MD                                                             NUGENT  Performed By:     Reinaldo Raddle  [facility]  [facility]      [facility] ---------------------------------------------------------------------- Orders  #  Description                           Code        Ordered By  1  US MFM OB FOLLOW UP                   [id number]    RAVI SHANKAR ----------------------------------------------------------------------  #  Order #                     Accession #                Episode #  1  [id number]                   [id number]                 [id number] ---------------------------------------------------------------------- Indications  Vaginal  bleeding in pregnancy, third trimester O46.93  Advanced maternal age multigravida 7335+,        73O09.522  second trimester (35 yrs)  Hypothyroid                                    O99.280 E03.9  Low Risk NIPS  Genetic carrier (Silent Carrier Alpha Thal)    Z14.8  Velamentous insertion of umbilical cord        O43.129  [redacted] weeks gestation of pregnancy                Z3A.32 ---------------------------------------------------------------------- Fetal Evaluation  Num Of Fetuses:         1  Fetal Heart Rate(bpm):  137  Cardiac Activity:       Observed  Presentation:           Breech  Placenta:               Fundal  P. Cord Insertion:      Velamentous insertion prev  Amniotic Fluid  AFI FV:      Within normal limits  AFI Sum(cm)     %Tile       Largest Pocket(cm)  14.9  52          6.25  RUQ(cm)       RLQ(cm)       LUQ(cm)        LLQ(cm)  6.25          4.62          2.35           1.68 ---------------------------------------------------------------------- Biometry  BPD:      82.8  mm     G. Age:  33w 2d         79  %    CI:        77.47   %    70 - 86                                                          FL/HC:      20.5   %    19.1 - 21.3  HC:      297.8  mm     G. Age:  33w 0d         38  %    HC/AC:      1.07        0.96 - 1.17  AC:      278.5  mm     G. Age:  31w 6d         46  %    FL/BPD:     73.8   %    71 - 87  FL:       61.1  mm     G. Age:  31w 5d         29  %    FL/AC:      21.9   %    20 - 24  LV:        2.3  mm  Est. FW:    1897  gm      4 lb 3 oz     41  % ---------------------------------------------------------------------- OB History  Gravidity:    1 ---------------------------------------------------------------------- Gestational Age  Clinical EDD:  32w 0d                                        EDD:   01/03/21  U/S Today:     32w 3d                                        EDD:   12/31/20  Best:          32w 0d     Det. By:  Clinical EDD             EDD:   01/03/21  ---------------------------------------------------------------------- Anatomy  Cranium:               Appears normal         Aortic Arch:            Previously seen  Cavum:                 Appears normal  Ductal Arch:            Previously seen  Ventricles:            Appears normal         Diaphragm:              Appears normal  Choroid Plexus:        Previously seen        Stomach:                Appears normal, left                                                                        sided  Cerebellum:            Previously seen        Abdomen:                Previously seen  Posterior Fossa:       Previously seen        Abdominal Wall:         Previously seen  Nuchal Fold:           Previously seen        Cord Vessels:           Previously seen  Face:                  previously seen        Kidneys:                Appear normal  Lips:                  Previously seen        Bladder:                Appears normal  Thoracic:              Previously seen        Spine:                  Previously seen  Heart:                 Appears normal         Upper Extremities:      Previously seen                         (4CH, axis, and                         situs)  RVOT:                  Previously seen        Lower Extremities:      Previously seen  LVOT:                  Appears normal  Other:  Fetus appears to be a female. Heels previously visualized. Technically          difficult due to maternal habitus and fetal position. ---------------------------------------------------------------------- Cervix Uterus Adnexa  Cervix  Not visualized (advanced GA >24wks)  Uterus  No abnormality  visualized.  Right Ovary  Not visualized.  Left Ovary  Within normal limits.  Cul De Sac  No free fluid seen.  Adnexa  No adnexal mass visualized. ---------------------------------------------------------------------- Impression  Follow up growth due to velamentous  Normal interval growth with measurements consistent with  dates  Good  fetal movement and amniotic fluid volume  Breech presentation today. ---------------------------------------------------------------------- Recommendations  Follow up growth in 4 weeks  Assess fetal position at that time. ----------------------------------------------------------------------               Lin Landsman, MD Electronically Signed Final Report   11/08/2020 10:04 am ----------------------------------------------------------------------  [place] MFM OB LIMITED  Result Date: 10/22/2020 ----------------------------------------------------------------------  OBSTETRICS REPORT                         (Signed Final 10/22/2020 11:24 am) ---------------------------------------------------------------------- Patient Info  ID #:        [id number]                          D.O.B.:  November 10, 1985 (35 yrs)  Name:        Brittney Salazar                 Visit Date: 10/21/2020 11:40 pm ---------------------------------------------------------------------- Performed By  Attending:         Ma Rings MD         Secondary Phy.:    NICOLE E NUGENT  Performed By:      Percell Boston          [facility]  Referred By:       [facility] MAU/Triage ---------------------------------------------------------------------- Orders  #   Description                          Code         Ordered By  1   [place] MFM OB LIMITED                    79892.11     Duwayne Heck SIMPSON ----------------------------------------------------------------------  #   Order #                    Accession #                 Episode #  1   [id number]                  [id number]                  [id number] ---------------------------------------------------------------------- Indications  Vaginal bleeding in pregnancy, third trimester  O46.93  [redacted] weeks gestation of pregnancy                 Z3A.35  Advanced maternal age multigravida 4+,         O91.522  second trimester (35 yrs)   Hypothyroid  O99.280 E03.9  Low Risk NIPS  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 Cord Insertion:       Velamentous insertion(prev)  Amniotic Fluid  AFI FV:      Within normal limits  AFI Sum(cm)     %Tile       Largest Pocket(cm)  10.5            17          4.6  RUQ(cm)       RLQ(cm)        LUQ(cm)        LLQ(cm)  3.3           4.6            2.6            0 ---------------------------------------------------------------------- OB History  Gravidity:     1 ---------------------------------------------------------------------- Gestational Age  Clinical EDD:   29w 3d                                         EDD:  01/03/21  Best:           29w 3d    Det. By:  Clinical EDD               EDD:  01/03/21 ---------------------------------------------------------------------- Anatomy  Thoracic:               Appears normal         Bladder:                Appears normal  Stomach:                Appears normal, left                          sided ---------------------------------------------------------------------- Cervix Uterus Adnexa  Cervix  Not visualized (advanced GA >24wks)  Uterus  No abnormality visualized.  Right Ovary  No adnexal mass visualized.  Left Ovary  No adnexal mass visualized.  Cul De Sac  No free fluid seen.  Adnexa  No abnormality visualized. ---------------------------------------------------------------------- Comments  This patient presented to the MAU due to vaginal bleeding.  A limited ultrasound performed today shows that the fetus is in  the breech presentation.  There was normal amniotic fluid noted.  A normal appearing fundal placenta is  noted. ----------------------------------------------------------------------                    Ma Rings, MD Electronically Signed Final Report   10/22/2020 11:24 am ----------------------------------------------------------------------   Assessment and Plan:  Pregnancy: G1P0000 at [redacted]w[redacted]d 1. COVID-19 affecting pregnancy in third trimester Respiratory distress precautions reviewed. Continue Flovent as instructed by PCP.  2. Velamentous insertion of umbilical cord, antepartum Scheduled for 36 week growth scan.  3. [redacted] weeks gestation of pregnancy 4. Supervision of  high risk pregnancy, antepartum Preterm labor symptoms and general obstetric precautions including but not limited to vaginal bleeding, contractions, leaking of fluid and fetal movement were reviewed in detail with the patient. I discussed the assessment and treatment plan with the patient. The patient was provided an opportunity to ask questions and all were answered. The patient agreed with the plan and demonstrated an understanding of the instructions. The patient was advised to call back or seek an in-person office evaluation/go to MAU at [facility] for any urgent or concerning symptoms. Please refer to After Visit Summary for other counseling recommendations.   I provided 14 minutes of face-to-face time during this encounter.  Return in about 2 weeks (around 11/24/2020) for OFFICE OB VISIT (MD only) as scheduled.  Future Appointments  Date Time Provider Department Center  11/24/2020  8:55 AM Shadawn Hanaway, Jethro Bastos, MD [facility]  12/07/2020  9:15 AM [facility] NURSE [facility]  12/07/2020  9:30 AM [facility] US3 [facility]  12/08/2020  8:35 AM Reva Bores, MD [facility]    Jaynie Collins, MD [facility]

## 2020-11-10 NOTE — Patient Instructions (Signed)
Return to office for any scheduled appointments. Call the office or go to the MA[facility] at [facility] if:  You begin to have strong, frequent contractions  Your water breaks.  Sometimes it is a big gush of fluid, sometimes it is just a trickle that keeps getting your panties wet or running down your legs  You have vaginal bleeding.  It is normal to have a small amount of spotting if your cervix was checked.   You do not feel your baby moving like normal.  If you do not, get something to eat and drink and lay down and focus on feeling your baby move.   If your baby is still not moving like normal, you should call the office or go to MAU.  Any other obstetric concerns.   

## 2020-11-14 NOTE — Telephone Encounter (Signed)
She might be able to do a home test but that is a question for her job whether they accept that and what kind of documentation they need. Public health guidance is not have have negative testing to return to work or school but rather appropriately being out during the quarantine period.  She can print out the [facility] DHHS guidelines for COVID return to work policies.

## 2020-11-24 ENCOUNTER — Ambulatory Visit (INDEPENDENT_AMBULATORY_CARE_PROVIDER_SITE_OTHER): Payer: 59 | Admitting: Obstetrics & Gynecology

## 2020-11-24 ENCOUNTER — Other Ambulatory Visit: Payer: Self-pay

## 2020-11-24 VITALS — BP 119/73 | HR 89 | Wt 280.2 lb

## 2020-11-24 DIAGNOSIS — O43129 Velamentous insertion of umbilical cord, unspecified trimester: Secondary | ICD-10-CM

## 2020-11-24 DIAGNOSIS — O0990 Supervision of high risk pregnancy, unspecified, unspecified trimester: Secondary | ICD-10-CM

## 2020-11-24 DIAGNOSIS — Z3A34 34 weeks gestation of pregnancy: Secondary | ICD-10-CM

## 2020-11-24 NOTE — Progress Notes (Signed)
PRENATAL VISIT NOTE  Subjective:  Brittney Salazar is a 35 y.o. G1P0000 at [redacted]w[redacted]d being seen today for ongoing prenatal care.  She is currently monitored for the following issues for this high-risk pregnancy and has Infertility associated with anovulation; Polycystic ovarian syndrome; Elevated testosterone level in female; History of herpes genitalis; Vitamin D deficiency; Hirsutism; Supervision of high risk pregnancy, antepartum; BMI 40.0-44.9, adult (HCC); Maternal morbid obesity, antepartum (HCC); Alpha thalassemia silent carrier; History of ectopic pregnancy; IUI Pregnancy; Velamentous insertion of umbilical cord, antepartum; History of hypothyroidism; Multigravida of advanced maternal age in third trimester; Third trimester bleeding, antepartum; and COVID-19 affecting pregnancy in third trimester on their problem list.  Patient reports no complaints.  Contractions: Irritability. Vag. Bleeding: None.  Movement: Present. Denies leaking of fluid.   The following portions of the patient's history were reviewed and updated as appropriate: allergies, current medications, past family history, past medical history, past social history, past surgical history and problem list.   Objective:   Vitals:   11/24/20 0908  BP: 119/73  Pulse: 89  Weight: 280 lb 3.2 oz (127.1 kg)    Fetal Status: Fetal Heart Rate (bpm): 134   Movement: Present     General:  Alert, oriented and cooperative. Patient is in no acute distress.  Skin: Skin is warm and dry. No rash noted.   Cardiovascular: Normal heart rate noted  Respiratory: Normal respiratory effort, no problems with respiration noted  Abdomen: Soft, gravid, appropriate for gestational age.  Pain/Pressure: Present     Pelvic: Cervical exam deferred        Extremities: Normal range of motion.     Mental Status: Normal mood and affect. Normal behavior. Normal judgment and thought content.   Imaging: [place] MFM OB FOLLOW UP  Result Date:  11/08/2020 ----------------------------------------------------------------------  OBSTETRICS REPORT                       (Signed Final 11/08/2020 10:04 am) ---------------------------------------------------------------------- Patient Info  ID #:       [id number]                          D.O.B.:  10-14-1985 (34 yrs)  Name:       Brittney Salazar                 Visit Date: 11/08/2020 08:56 am ---------------------------------------------------------------------- Performed By  Attending:        Lin Landsman      Secondary Phy.:   Odie Sera                    MD                                                             NUGENT  Performed By:     Reinaldo Raddle            [facility]  Referred By:      [facility] MAU/Triage ---------------------------------------------------------------------- Orders  #  Description                           Code        Ordered By  1  [place] MFM OB FOLLOW UP                   16109.60    RAVI [facility] ----------------------------------------------------------------------  #  Order #                     Accession #                Episode #  1  [id number]                   [id number]                 [id number] ---------------------------------------------------------------------- Indications  Vaginal bleeding in pregnancy, third trimester O46.93  Advanced maternal age multigravida 28+,        O36.522  second trimester (34 yrs)  Hypothyroid                                    O99.280 E03.9  Low Risk NIPS  Genetic carrier (Silent Carrier Alpha Thal)    Z14.8  Velamentous insertion of umbilical cord        O43.129  [redacted] weeks gestation of pregnancy                Z3A.32 ---------------------------------------------------------------------- Fetal Evaluation  Num Of  Fetuses:         1  Fetal Heart Rate(bpm):  137  Cardiac Activity:       Observed  Presentation:           Breech  Placenta:               Fundal  P. Cord Insertion:      Velamentous insertion prev  Amniotic Fluid  AFI FV:      Within normal limits  AFI Sum(cm)     %Tile       Largest Pocket(cm)  14.9            52          6.25  RUQ(cm)       RLQ(cm)       LUQ(cm)        LLQ(cm)  6.25          4.62          2.35           1.68 ----------------------------------------------------------------------  Biometry  BPD:      82.8  mm     G. Age:  33w 2d         79  %    CI:        77.47   %    70 - 86                                                          FL/HC:      20.5   %    19.1 - 21.3  HC:      297.8  mm     G. Age:  33w 0d         38  %    HC/AC:      1.07        0.96 - 1.17  AC:      278.5  mm     G. Age:  31w 6d         46  %    FL/BPD:     73.8   %    71 - 87  FL:       61.1  mm     G. Age:  31w 5d         29  %    FL/AC:      21.9   %    20 - 24  LV:        2.3  mm  Est. FW:    1897  gm      4 lb 3 oz     41  % ---------------------------------------------------------------------- OB History  Gravidity:    1 ---------------------------------------------------------------------- Gestational Age  Clinical EDD:  32w 0d                                        EDD:   01/03/21  U/S Today:     32w 3d                                        EDD:   12/31/20  Best:          32w 0d     Det. By:  Clinical EDD             EDD:   01/03/21 ---------------------------------------------------------------------- Anatomy  Cranium:               Appears normal         Aortic Arch:            Previously seen  Cavum:                 Appears normal         Ductal Arch:            Previously seen  Ventricles:            Appears normal         Diaphragm:              Appears normal  Choroid Plexus:        Previously seen  Stomach:                Appears normal, left                                                                         sided  Cerebellum:            Previously seen        Abdomen:                Previously seen  Posterior Fossa:       Previously seen        Abdominal Wall:         Previously seen  Nuchal Fold:           Previously seen        Cord Vessels:           Previously seen  Face:                  previously seen        Kidneys:                Appear normal  Lips:                  Previously seen        Bladder:                Appears normal  Thoracic:              Previously seen        Spine:                  Previously seen  Heart:                 Appears normal         Upper Extremities:      Previously seen                         (4CH, axis, and                         situs)  RVOT:                  Previously seen        Lower Extremities:      Previously seen  LVOT:                  Appears normal  Other:  Fetus appears to be a female. Heels previously visualized. Technically          difficult due to maternal habitus and fetal position. ---------------------------------------------------------------------- Cervix Uterus Adnexa  Cervix  Not visualized (advanced GA >24wks)  Uterus  No abnormality visualized.  Right Ovary  Not visualized.  Left Ovary  Within normal limits.  Cul De Sac  No free fluid seen.  Adnexa  No adnexal mass visualized. ---------------------------------------------------------------------- Impression  Follow up growth due to velamentous  Normal interval growth with measurements consistent with  dates  Good fetal movement and amniotic fluid volume  Breech presentation today. ---------------------------------------------------------------------- Recommendations  Follow up growth in 4 weeks  Assess  fetal position at that time. ----------------------------------------------------------------------               Lin Landsmanorenthian Booker, MD Electronically Signed Final Report   11/08/2020 10:04 am ----------------------------------------------------------------------   Assessment and Plan:  Pregnancy:  G1P0000 at 6675w2d 1. Velamentous insertion of umbilical cord, antepartum Continue MFM scans, will follow up recommendations as per delivery plan. Bleeding precautions reviewed.  2. [redacted] weeks gestation of pregnancy 3. Supervision of high risk pregnancy, antepartum No other concerns currently.  Preterm labor symptoms and general obstetric precautions including but not limited to vaginal bleeding, contractions, leaking of fluid and fetal movement were reviewed in detail with the patient. Please refer to After Visit Summary for other counseling recommendations.   No follow-ups on file.  Future Appointments  Date Time Provider Department Center  12/07/2020  9:15 AM [facility]  12/07/2020  9:30 AM [facility]  12/08/2020  8:35 AM Reva BoresPratt, Tanya S, MD [facility]    Jaynie CollinsUgonna Lokelani Lutes, MD

## 2020-12-02 ENCOUNTER — Other Ambulatory Visit: Payer: Self-pay

## 2020-12-02 DIAGNOSIS — O0990 Supervision of high risk pregnancy, unspecified, unspecified trimester: Secondary | ICD-10-CM

## 2020-12-02 MED ORDER — DOCUSATE SODIUM 100 MG PO CAPS: 100.0000 mg | ORAL_CAPSULE | Freq: Two times a day (BID) | ORAL | 0 refills | Status: DC | PRN

## 2020-12-07 ENCOUNTER — Encounter: Payer: Self-pay | Admitting: *Deleted

## 2020-12-07 ENCOUNTER — Ambulatory Visit: Payer: 59 | Attending: Maternal & Fetal Medicine

## 2020-12-07 ENCOUNTER — Other Ambulatory Visit: Payer: Self-pay | Admitting: Maternal & Fetal Medicine

## 2020-12-07 ENCOUNTER — Ambulatory Visit: Payer: 59 | Admitting: *Deleted

## 2020-12-07 ENCOUNTER — Other Ambulatory Visit: Payer: Self-pay

## 2020-12-07 VITALS — BP 127/69 | HR 83

## 2020-12-07 DIAGNOSIS — O0990 Supervision of high risk pregnancy, unspecified, unspecified trimester: Secondary | ICD-10-CM | POA: Insufficient documentation

## 2020-12-07 DIAGNOSIS — Z3A36 36 weeks gestation of pregnancy: Secondary | ICD-10-CM

## 2020-12-07 DIAGNOSIS — O09513 Supervision of elderly primigravida, third trimester: Secondary | ICD-10-CM | POA: Diagnosis present

## 2020-12-07 DIAGNOSIS — U071 COVID-19: Secondary | ICD-10-CM

## 2020-12-07 DIAGNOSIS — O99283 Endocrine, nutritional and metabolic diseases complicating pregnancy, third trimester: Secondary | ICD-10-CM | POA: Diagnosis not present

## 2020-12-07 DIAGNOSIS — O4693 Antepartum hemorrhage, unspecified, third trimester: Secondary | ICD-10-CM

## 2020-12-07 DIAGNOSIS — Z148 Genetic carrier of other disease: Secondary | ICD-10-CM

## 2020-12-07 DIAGNOSIS — O09523 Supervision of elderly multigravida, third trimester: Secondary | ICD-10-CM

## 2020-12-07 DIAGNOSIS — E039 Hypothyroidism, unspecified: Secondary | ICD-10-CM | POA: Diagnosis not present

## 2020-12-07 DIAGNOSIS — O321XX0 Maternal care for breech presentation, not applicable or unspecified: Secondary | ICD-10-CM

## 2020-12-07 DIAGNOSIS — O43123 Velamentous insertion of umbilical cord, third trimester: Secondary | ICD-10-CM | POA: Diagnosis not present

## 2020-12-07 DIAGNOSIS — O98513 Other viral diseases complicating pregnancy, third trimester: Secondary | ICD-10-CM | POA: Diagnosis present

## 2020-12-08 ENCOUNTER — Ambulatory Visit (INDEPENDENT_AMBULATORY_CARE_PROVIDER_SITE_OTHER): Payer: 59 | Admitting: Family Medicine

## 2020-12-08 ENCOUNTER — Other Ambulatory Visit (HOSPITAL_COMMUNITY)
Admission: RE | Admit: 2020-12-08 | Discharge: 2020-12-08 | Disposition: A | Discharge status: Home / Self Care | Payer: 59 | Source: Ambulatory Visit | Attending: Family Medicine | Admitting: Family Medicine

## 2020-12-08 ENCOUNTER — Telehealth: Payer: Self-pay | Admitting: *Deleted

## 2020-12-08 ENCOUNTER — Encounter: Payer: Self-pay | Admitting: Family Medicine

## 2020-12-08 VITALS — BP 120/78 | HR 81 | Wt 281.8 lb

## 2020-12-08 DIAGNOSIS — Z3A36 36 weeks gestation of pregnancy: Secondary | ICD-10-CM

## 2020-12-08 DIAGNOSIS — O0990 Supervision of high risk pregnancy, unspecified, unspecified trimester: Secondary | ICD-10-CM

## 2020-12-08 DIAGNOSIS — O09523 Supervision of elderly multigravida, third trimester: Secondary | ICD-10-CM

## 2020-12-08 DIAGNOSIS — Z8619 Personal history of other infectious and parasitic diseases: Secondary | ICD-10-CM

## 2020-12-08 DIAGNOSIS — Z8639 Personal history of other endocrine, nutritional and metabolic disease: Secondary | ICD-10-CM

## 2020-12-08 DIAGNOSIS — O43129 Velamentous insertion of umbilical cord, unspecified trimester: Secondary | ICD-10-CM

## 2020-12-08 MED ORDER — VALACYCLOVIR HCL 1 G PO TABS: 500.0000 mg | ORAL_TABLET | Freq: Two times a day (BID) | ORAL | 0 refills | Status: DC

## 2020-12-08 NOTE — Progress Notes (Signed)
    PRENATAL VISIT NOTE  Subjective:  Brittney Salazar is a 35 y.o. G1P0000 at [redacted]w[redacted]d being seen today for ongoing prenatal care.  She is currently monitored for the following issues for this high-risk pregnancy and has Infertility associated with anovulation; Polycystic ovarian syndrome; Elevated testosterone level in female; History of herpes genitalis; Vitamin D deficiency; Hirsutism; Supervision of high risk pregnancy, antepartum; BMI 40.0-44.9, adult (HCC); Maternal morbid obesity, antepartum (HCC); Alpha thalassemia silent carrier; History of ectopic pregnancy; IUI Pregnancy; Velamentous insertion of umbilical cord, antepartum; History of hypothyroidism; Multigravida of advanced maternal age in third trimester; Third trimester bleeding, antepartum; and COVID-19 affecting pregnancy in third trimester on their problem list.  Patient reports no complaints.  Contractions: Irritability. Vag. Bleeding: None.  Movement: Present. Denies leaking of fluid.   The following portions of the patient's history were reviewed and updated as appropriate: allergies, current medications, past family history, past medical history, past social history, past surgical history and problem list.   Objective:   Vitals:   12/08/20 0837  BP: 120/78  Pulse: 81  Weight: 281 lb 12.8 oz (127.8 kg)    Fetal Status: Fetal Heart Rate (bpm): 131 Fundal Height: 37 cm Movement: Present  Presentation: Complete Breech  General:  Alert, oriented and cooperative. Patient is in no acute distress.  Skin: Skin is warm and dry. No rash noted.   Cardiovascular: Normal heart rate noted  Respiratory: Normal respiratory effort, no problems with respiration noted  Abdomen: Soft, gravid, appropriate for gestational age.  Pain/Pressure: Present     Pelvic: Cervical exam performed in the presence of a chaperone Dilation: Closed Effacement (%): 70 Station: Ballotable  Extremities: Normal range of motion.  Edema: Trace  Mental Status:  Normal mood and affect. Normal behavior. Normal judgment and thought content.   Assessment and Plan:  Pregnancy: G1P0000 at [redacted]w[redacted]d 1. Supervision of high risk pregnancy, antepartum Cultures today Breech presentation--to try holistic maneuvers to get baby to turn - Strep Gp B NAA - Cervicovaginal ancillary only( [place])  2. Velamentous insertion of umbilical cord, antepartum In testing, normally grown Hesitant to do ECV due to this  3. History of herpes genitalis Rx sent in today  4. Multigravida of advanced maternal age in third trimester Low risk NIPT  5. History of hypothyroidism Last TSH was ok  Preterm labor symptoms and general obstetric precautions including but not limited to vaginal bleeding, contractions, leaking of fluid and fetal movement were reviewed in detail with the patient. Please refer to After Visit Summary for other counseling recommendations.   Return in 1 week (on 12/15/2020).  Future Appointments  Date Time Provider Department Center  12/13/2020  4:30 PM [place] Bing, MD [facility]  12/14/2020 11:15 AM [facility]  12/21/2020 11:15 AM [facility]  12/22/2020  1:00 PM [place] Bing, MD [facility]  12/27/2020 10:15 AM [place] Bing, MD [facility]  12/28/2020 11:15 AM [facility]    Reva Bores, MD

## 2020-12-08 NOTE — Telephone Encounter (Signed)
Called pt to let her know we have sent on valtrex to start taking BID until delivery. Pt verblaizes and understands

## 2020-12-08 NOTE — Progress Notes (Signed)
Pt presents for ROB and GBS. Pt c/o pain in both wrist.

## 2020-12-08 NOTE — Patient Instructions (Signed)

## 2020-12-09 LAB — CERVICOVAGINAL ANCILLARY ONLY
Chlamydia: NEGATIVE
Comment: NEGATIVE
Comment: NORMAL
Neisseria Gonorrhea: NEGATIVE

## 2020-12-10 LAB — STREP GP B NAA: Strep Gp B NAA: NEGATIVE

## 2020-12-13 ENCOUNTER — Ambulatory Visit (INDEPENDENT_AMBULATORY_CARE_PROVIDER_SITE_OTHER): Payer: 59 | Admitting: Obstetrics and Gynecology

## 2020-12-13 ENCOUNTER — Other Ambulatory Visit: Payer: Self-pay

## 2020-12-13 VITALS — BP 124/86 | HR 91 | Wt 280.4 lb

## 2020-12-13 DIAGNOSIS — O98513 Other viral diseases complicating pregnancy, third trimester: Secondary | ICD-10-CM

## 2020-12-13 DIAGNOSIS — U071 COVID-19: Secondary | ICD-10-CM

## 2020-12-13 DIAGNOSIS — Z8639 Personal history of other endocrine, nutritional and metabolic disease: Secondary | ICD-10-CM

## 2020-12-13 DIAGNOSIS — O329XX0 Maternal care for malpresentation of fetus, unspecified, not applicable or unspecified: Secondary | ICD-10-CM

## 2020-12-13 DIAGNOSIS — O99210 Obesity complicating pregnancy, unspecified trimester: Secondary | ICD-10-CM

## 2020-12-13 DIAGNOSIS — O09523 Supervision of elderly multigravida, third trimester: Secondary | ICD-10-CM

## 2020-12-13 DIAGNOSIS — Z8619 Personal history of other infectious and parasitic diseases: Secondary | ICD-10-CM

## 2020-12-13 DIAGNOSIS — Z319 Encounter for procreative management, unspecified: Secondary | ICD-10-CM

## 2020-12-13 DIAGNOSIS — Z3A37 37 weeks gestation of pregnancy: Secondary | ICD-10-CM

## 2020-12-13 DIAGNOSIS — O43129 Velamentous insertion of umbilical cord, unspecified trimester: Secondary | ICD-10-CM

## 2020-12-13 DIAGNOSIS — Z6841 Body Mass Index (BMI) 40.0 and over, adult: Secondary | ICD-10-CM

## 2020-12-13 DIAGNOSIS — O0990 Supervision of high risk pregnancy, unspecified, unspecified trimester: Secondary | ICD-10-CM

## 2020-12-14 ENCOUNTER — Ambulatory Visit: Payer: 59 | Admitting: *Deleted

## 2020-12-14 ENCOUNTER — Ambulatory Visit (INDEPENDENT_AMBULATORY_CARE_PROVIDER_SITE_OTHER): Payer: 59

## 2020-12-14 DIAGNOSIS — O329XX0 Maternal care for malpresentation of fetus, unspecified, not applicable or unspecified: Secondary | ICD-10-CM | POA: Insufficient documentation

## 2020-12-14 DIAGNOSIS — O99210 Obesity complicating pregnancy, unspecified trimester: Secondary | ICD-10-CM

## 2020-12-14 NOTE — Progress Notes (Signed)

## 2020-12-14 NOTE — Progress Notes (Signed)
PRENATAL VISIT NOTE  Subjective:  Brittney Salazar is a 35 y.o. G1P0000 at [redacted]w[redacted]d being seen today for ongoing prenatal care.  She is currently monitored for the following issues for this high-risk pregnancy and has Infertility associated with anovulation; Polycystic ovarian syndrome; Elevated testosterone level in female; Vitamin D deficiency; Hirsutism; Supervision of high risk pregnancy, antepartum; BMI 40.0-44.9, adult (HCC); Maternal morbid obesity, antepartum (HCC); Alpha thalassemia silent carrier; History of ectopic pregnancy; IUI Pregnancy; Velamentous insertion of umbilical cord, antepartum; History of hypothyroidism; Multigravida of advanced maternal age in third trimester; Third trimester bleeding, antepartum; and COVID-19 affecting pregnancy in third trimester on their problem list.  Patient reports no complaints.  Contractions: Irritability. Vag. Bleeding: None.  Movement: Present. Denies leaking of fluid.   The following portions of the patient's history were reviewed and updated as appropriate: allergies, current medications, past family history, past medical history, past social history, past surgical history and problem list.   Objective:   Vitals:   12/13/20 1637  BP: 124/86  Pulse: 91  Weight: 280 lb 6.4 oz (127.2 kg)    Fetal Status: Fetal Heart Rate (bpm): 136   Movement: Present  Presentation: Complete Breech  General:  Alert, oriented and cooperative. Patient is in no acute distress.  Skin: Skin is warm and dry. No rash noted.   Cardiovascular: Normal heart rate noted  Respiratory: Normal respiratory effort, no problems with respiration noted  Abdomen: Soft, gravid, appropriate for gestational age.  Pain/Pressure: Present     Pelvic: Cervical exam deferred        Extremities: Normal range of motion.     Mental Status: Normal mood and affect. Normal behavior. Normal judgment and thought content.   Assessment and Plan:  Pregnancy: G1P0000 at [redacted]w[redacted]d 1.  Malpresentation before onset of labor, single or unspecified fetus Still feels breech. Has bpp tomorrow. D/w her re: what an ECV is and that I don't recommend it given the velamentous cord insertion. If still breech, pt is desiring c-section and can schedule based on results tomorrow  2. Velamentous insertion of umbilical cord, antepartum See above. Continue qwk testing. D/w her recommend delivery at 39wks.  8/17: afi 24, borderline poly, efw 2928gm, 59%, ac 39%  3. Supervision of high risk pregnancy, antepartum GBS neg  4. Multigravida of advanced maternal age in third trimester No issues  5. IUI Pregnancy  6. Maternal morbid obesity, antepartum (HCC)   7. BMI 40.0-44.9, adult (HCC)  8. COVID-19 affecting pregnancy in third trimester No sequlae  9. History of herpes genitalis Pt wondering about this on her problem list. She denies any h/o outbreaks or blood testing. It looks like it was from 2013. Notes from around this time reviewed and no mention of hsv. I told her rationale for valtrex and will delete off her problem list  10. History of hypothyroidism Normal TFTs in third trimester  11. [redacted] weeks gestation of pregnancy  Term labor symptoms and general obstetric precautions including but not limited to vaginal bleeding, contractions, leaking of fluid and fetal movement were reviewed in detail with the patient. Please refer to After Visit Summary for other counseling recommendations.   No follow-ups on file.  Future Appointments  Date Time Provider Department Center  12/14/2020 11:15 AM [facility]  12/21/2020 11:15 AM [facility]  12/22/2020  1:00 PM [place] Bing, MD [facility]  12/27/2020 10:15 AM [place] Bing, MD [facility]  12/28/2020 11:15 AM [facility]  Aletha Halim, MD

## 2020-12-15 ENCOUNTER — Encounter: Payer: Self-pay | Admitting: *Deleted

## 2020-12-15 ENCOUNTER — Telehealth: Payer: Self-pay | Admitting: *Deleted

## 2020-12-15 NOTE — Telephone Encounter (Signed)
Call to patient. Voice mail has number confirmation. Left message procedure scheduled as discussed with MD for 12-27-20 at 1:00 pm, arrive at 11 am. Will receive pre-op call from hospital.  Encounter closed.

## 2020-12-19 ENCOUNTER — Other Ambulatory Visit: Payer: Self-pay | Admitting: Family Medicine

## 2020-12-20 ENCOUNTER — Encounter (HOSPITAL_COMMUNITY): Payer: Self-pay

## 2020-12-20 ENCOUNTER — Encounter (HOSPITAL_COMMUNITY): Payer: Self-pay | Admitting: Family Medicine

## 2020-12-20 NOTE — Patient Instructions (Signed)
Brittney Salazar  12/20/2020   Your procedure is scheduled on:  12/27/2020  Arrive at 1100 at Entrance C on [address] at [facility]  and [address]. You are invited to use the FREE valet parking or use the Visitor's parking deck.  Pick up the phone at the desk and dial [phone].  Call this number if you have problems the morning of surgery: [phone]  Remember:   Do not eat food:(After Midnight) Desps de medianoche.  Do not drink clear liquids: (After Midnight) Desps de medianoche.  Take these medicines the morning of surgery with A SIP OF WATER:  Take levothyroxine as prescribed.  Please bring your inhalers with you.   Do not wear jewelry, make-up or nail polish.  Do not wear lotions, powders, or perfumes. Do not wear deodorant.  Do not shave 48 hours prior to surgery.  Do not bring valuables to the hospital.  [facility] is not   responsible for any belongings or valuables brought to the hospital.  Contacts, dentures or bridgework may not be worn into surgery.  Leave suitcase in the car. After surgery it may be brought to your room.  For patients admitted to the hospital, checkout time is 11:00 AM the day of              discharge.      Please read over the following fact sheets that you were given:     Preparing for Surgery

## 2020-12-21 ENCOUNTER — Ambulatory Visit (INDEPENDENT_AMBULATORY_CARE_PROVIDER_SITE_OTHER): Payer: 59

## 2020-12-21 ENCOUNTER — Other Ambulatory Visit: Payer: Self-pay

## 2020-12-21 ENCOUNTER — Ambulatory Visit: Payer: 59 | Admitting: *Deleted

## 2020-12-21 DIAGNOSIS — O99210 Obesity complicating pregnancy, unspecified trimester: Secondary | ICD-10-CM | POA: Diagnosis not present

## 2020-12-21 NOTE — Progress Notes (Signed)

## 2020-12-22 ENCOUNTER — Encounter: Payer: 59 | Admitting: Obstetrics and Gynecology

## 2020-12-22 ENCOUNTER — Encounter: Payer: Self-pay | Admitting: Obstetrics and Gynecology

## 2020-12-22 ENCOUNTER — Ambulatory Visit (INDEPENDENT_AMBULATORY_CARE_PROVIDER_SITE_OTHER): Payer: 59 | Admitting: Obstetrics and Gynecology

## 2020-12-22 VITALS — BP 132/83 | HR 76 | Wt 287.6 lb

## 2020-12-22 DIAGNOSIS — O99210 Obesity complicating pregnancy, unspecified trimester: Secondary | ICD-10-CM

## 2020-12-22 DIAGNOSIS — O09523 Supervision of elderly multigravida, third trimester: Secondary | ICD-10-CM

## 2020-12-22 DIAGNOSIS — Z8639 Personal history of other endocrine, nutritional and metabolic disease: Secondary | ICD-10-CM

## 2020-12-22 DIAGNOSIS — O329XX0 Maternal care for malpresentation of fetus, unspecified, not applicable or unspecified: Secondary | ICD-10-CM

## 2020-12-22 DIAGNOSIS — Z3A38 38 weeks gestation of pregnancy: Secondary | ICD-10-CM

## 2020-12-22 DIAGNOSIS — O0990 Supervision of high risk pregnancy, unspecified, unspecified trimester: Secondary | ICD-10-CM

## 2020-12-22 DIAGNOSIS — O43129 Velamentous insertion of umbilical cord, unspecified trimester: Secondary | ICD-10-CM

## 2020-12-22 DIAGNOSIS — Z6841 Body Mass Index (BMI) 40.0 and over, adult: Secondary | ICD-10-CM

## 2020-12-22 NOTE — Progress Notes (Signed)
   PRENATAL VISIT NOTE  Subjective:  Brittney Salazar is a 35 y.o. G1P0000 at [redacted]w[redacted]d being seen today for ongoing prenatal care.  She is currently monitored for the following issues for this high-risk pregnancy and has Infertility associated with anovulation; Polycystic ovarian syndrome; Elevated testosterone level in female; Vitamin D deficiency; Hirsutism; Supervision of high risk pregnancy, antepartum; BMI 40.0-44.9, adult (HCC); Maternal morbid obesity, antepartum (HCC); Alpha thalassemia silent carrier; History of ectopic pregnancy; IUI Pregnancy; Velamentous insertion of umbilical cord, antepartum; History of hypothyroidism; Multigravida of advanced maternal age in third trimester; Third trimester bleeding, antepartum; COVID-19 affecting pregnancy in third trimester; and Malpresentation before onset of labor on their problem list.  Patient reports no complaints.  Contractions: Irritability. Vag. Bleeding: None.  Movement: Present. Denies leaking of fluid.   The following portions of the patient's history were reviewed and updated as appropriate: allergies, current medications, past family history, past medical history, past social history, past surgical history and problem list.   Objective:   Vitals:   12/22/20 1307  BP: 132/83  Pulse: 76  Weight: 287 lb 9.6 oz (130.5 kg)    Fetal Status: Fetal Heart Rate (bpm): 135   Movement: Present  Presentation: Homero Fellers Breech  General:  Alert, oriented and cooperative. Patient is in no acute distress.  Skin: Skin is warm and dry. No rash noted.   Cardiovascular: Normal heart rate noted  Respiratory: Normal respiratory effort, no problems with respiration noted  Abdomen: Soft, gravid, appropriate for gestational age.  Pain/Pressure: Present     Pelvic: Cervical exam deferred        Extremities: Normal range of motion.  Edema: Trace  Mental Status: Normal mood and affect. Normal behavior. Normal judgment and thought content.   Assessment and  Plan:  Pregnancy: G1P0000 at [redacted]w[redacted]d 1. Supervision of high risk pregnancy, antepartum Routine care Incision check 7-10d post op Undecided on BC  2. [redacted] weeks gestation of pregnancy  3. Velamentous insertion of umbilical cord, antepartum Normal growth on 8/17 Bpp reassuring yesterday  4. Multigravida of advanced maternal age in third trimester   5. Maternal morbid obesity, antepartum (HCC)   6. Malpresentation before onset of labor, single or unspecified fetus Still breech yesterday. For c/s on 9/6   Term labor symptoms and general obstetric precautions including but not limited to vaginal bleeding, contractions, leaking of fluid and fetal movement were reviewed in detail with the patient. Please refer to After Visit Summary for other counseling recommendations.   Return in about 15 days (around 01/06/2021) for in person, rn visit.  No future appointments.  [place] Bing, MD

## 2020-12-25 LAB — CBC
HCT: 33.6 % — ABNORMAL LOW (ref 36.0–46.0)
Hemoglobin: 11.2 g/dL — ABNORMAL LOW (ref 12.0–15.0)
MCH: 28.9 pg (ref 26.0–34.0)
MCHC: 33.3 g/dL (ref 30.0–36.0)
MCV: 86.6 fL (ref 80.0–100.0)
Platelets: 126 10*3/uL — ABNORMAL LOW (ref 150–400)
RBC: 3.88 MIL/uL (ref 3.87–5.11)
RDW: 13.8 % (ref 11.5–15.5)
WBC: 5.1 10*3/uL (ref 4.0–10.5)
nRBC: 0 % (ref 0.0–0.2)

## 2020-12-25 LAB — RAPID HIV SCREEN (HIV 1/2 AB+AG)
HIV 1/2 Antibodies: NONREACTIVE
HIV-1 P24 Antigen - HIV24: NONREACTIVE

## 2020-12-25 LAB — TYPE AND SCREEN
ABO/RH(D): O POS
Antibody Screen: NEGATIVE

## 2020-12-26 LAB — RPR: RPR Ser Ql: NONREACTIVE

## 2020-12-27 ENCOUNTER — Inpatient Hospital Stay (HOSPITAL_COMMUNITY): Payer: 59 | Admitting: Certified Registered Nurse Anesthetist

## 2020-12-27 ENCOUNTER — Other Ambulatory Visit: Payer: Self-pay

## 2020-12-27 ENCOUNTER — Inpatient Hospital Stay (HOSPITAL_COMMUNITY)
Admission: RE | Admit: 2020-12-27 | Discharge: 2020-12-30 | DRG: 787 | Disposition: A | Discharge status: Home / Self Care | Payer: 59 | Attending: Family Medicine | Admitting: Family Medicine

## 2020-12-27 ENCOUNTER — Encounter: Payer: 59 | Admitting: Obstetrics and Gynecology

## 2020-12-27 ENCOUNTER — Encounter (HOSPITAL_COMMUNITY): Payer: Self-pay | Admitting: Family Medicine

## 2020-12-27 ENCOUNTER — Encounter (HOSPITAL_COMMUNITY)
Admission: RE | Disposition: A | Discharge status: Home / Self Care | Payer: Self-pay | Source: Home / Self Care | Attending: Family Medicine

## 2020-12-27 DIAGNOSIS — Z98891 History of uterine scar from previous surgery: Secondary | ICD-10-CM

## 2020-12-27 DIAGNOSIS — O99210 Obesity complicating pregnancy, unspecified trimester: Secondary | ICD-10-CM | POA: Diagnosis present

## 2020-12-27 DIAGNOSIS — D563 Thalassemia minor: Secondary | ICD-10-CM | POA: Diagnosis present

## 2020-12-27 DIAGNOSIS — O98513 Other viral diseases complicating pregnancy, third trimester: Secondary | ICD-10-CM | POA: Diagnosis present

## 2020-12-27 DIAGNOSIS — O321XX0 Maternal care for breech presentation, not applicable or unspecified: Principal | ICD-10-CM | POA: Diagnosis present

## 2020-12-27 DIAGNOSIS — Z319 Encounter for procreative management, unspecified: Secondary | ICD-10-CM

## 2020-12-27 DIAGNOSIS — O43129 Velamentous insertion of umbilical cord, unspecified trimester: Secondary | ICD-10-CM | POA: Diagnosis present

## 2020-12-27 DIAGNOSIS — Z8639 Personal history of other endocrine, nutritional and metabolic disease: Secondary | ICD-10-CM

## 2020-12-27 DIAGNOSIS — O43123 Velamentous insertion of umbilical cord, third trimester: Secondary | ICD-10-CM | POA: Diagnosis present

## 2020-12-27 DIAGNOSIS — Z87891 Personal history of nicotine dependence: Secondary | ICD-10-CM

## 2020-12-27 DIAGNOSIS — E039 Hypothyroidism, unspecified: Secondary | ICD-10-CM | POA: Diagnosis present

## 2020-12-27 DIAGNOSIS — O9081 Anemia of the puerperium: Secondary | ICD-10-CM | POA: Diagnosis not present

## 2020-12-27 DIAGNOSIS — O99284 Endocrine, nutritional and metabolic diseases complicating childbirth: Secondary | ICD-10-CM | POA: Diagnosis present

## 2020-12-27 DIAGNOSIS — D62 Acute posthemorrhagic anemia: Secondary | ICD-10-CM | POA: Diagnosis not present

## 2020-12-27 DIAGNOSIS — Z8616 Personal history of COVID-19: Secondary | ICD-10-CM | POA: Diagnosis not present

## 2020-12-27 DIAGNOSIS — O329XX0 Maternal care for malpresentation of fetus, unspecified, not applicable or unspecified: Secondary | ICD-10-CM | POA: Diagnosis present

## 2020-12-27 DIAGNOSIS — O09523 Supervision of elderly multigravida, third trimester: Secondary | ICD-10-CM | POA: Diagnosis present

## 2020-12-27 DIAGNOSIS — O99214 Obesity complicating childbirth: Secondary | ICD-10-CM | POA: Diagnosis present

## 2020-12-27 DIAGNOSIS — Z3A39 39 weeks gestation of pregnancy: Secondary | ICD-10-CM

## 2020-12-27 DIAGNOSIS — U071 COVID-19: Secondary | ICD-10-CM | POA: Diagnosis present

## 2020-12-27 HISTORY — DX: Hypothyroidism, unspecified: E03.9

## 2020-12-27 SURGERY — Surgical Case
Anesthesia: Spinal | Wound class: Clean Contaminated

## 2020-12-27 MED ORDER — POVIDONE-IODINE 10 % EX SWAB
2.0000 "application " | Freq: Once | CUTANEOUS | Status: AC
  Administered 2020-12-27: 2 via TOPICAL

## 2020-12-27 MED ORDER — NALBUPHINE HCL 10 MG/ML IJ SOLN
5.0000 mg | INTRAMUSCULAR | Status: DC | PRN
Start: 2020-12-27 — End: 2020-12-30

## 2020-12-27 MED ORDER — KETOROLAC TROMETHAMINE 30 MG/ML IJ SOLN
INTRAMUSCULAR | Status: AC
  Filled 2020-12-27: qty 1

## 2020-12-27 MED ORDER — SODIUM CHLORIDE 0.9% FLUSH: 3.0000 mL | INTRAVENOUS | Status: DC | PRN

## 2020-12-27 MED ORDER — CEFAZOLIN SODIUM-DEXTROSE 2-4 GM/100ML-% IV SOLN: 2.0000 g | INTRAVENOUS | Status: DC

## 2020-12-27 MED ORDER — ACETAMINOPHEN 500 MG PO TABS: 1000.0000 mg | ORAL_TABLET | Freq: Once | ORAL | Status: DC

## 2020-12-27 MED ORDER — NALBUPHINE HCL 10 MG/ML IJ SOLN: 5.0000 mg | INTRAMUSCULAR | Status: DC | PRN

## 2020-12-27 MED ORDER — SIMETHICONE 80 MG PO CHEW
80.0000 mg | CHEWABLE_TABLET | ORAL | Status: DC | PRN
  Administered 2020-12-27: 80 mg via ORAL

## 2020-12-27 MED ORDER — NALBUPHINE HCL 10 MG/ML IJ SOLN
5.0000 mg | Freq: Once | INTRAMUSCULAR | Status: DC | PRN
Start: 2020-12-27 — End: 2020-12-30

## 2020-12-27 MED ORDER — CEFAZOLIN SODIUM-DEXTROSE 2-4 GM/100ML-% IV SOLN
INTRAVENOUS | Status: AC
  Filled 2020-12-27: qty 100

## 2020-12-27 MED ORDER — DIBUCAINE (PERIANAL) 1 % EX OINT: 1.0000 "application " | TOPICAL_OINTMENT | CUTANEOUS | Status: DC | PRN

## 2020-12-27 MED ORDER — STERILE WATER FOR IRRIGATION IR SOLN
Status: DC | PRN
  Administered 2020-12-27: 1000 mL

## 2020-12-27 MED ORDER — MORPHINE SULFATE (PF) 0.5 MG/ML IJ SOLN
INTRAMUSCULAR | Status: AC
  Filled 2020-12-27: qty 10

## 2020-12-27 MED ORDER — PROMETHAZINE HCL 25 MG/ML IJ SOLN: 6.2500 mg | INTRAMUSCULAR | Status: DC | PRN

## 2020-12-27 MED ORDER — MAGNESIUM HYDROXIDE 400 MG/5ML PO SUSP: 30.0000 mL | ORAL | Status: DC | PRN

## 2020-12-27 MED ORDER — PRENATAL MULTIVITAMIN CH
1.0000 | ORAL_TABLET | Freq: Every day | ORAL | Status: DC
  Administered 2020-12-28 – 2020-12-30 (×3): 1 via ORAL
  Filled 2020-12-27 (×3): qty 1

## 2020-12-27 MED ORDER — DIPHENHYDRAMINE HCL 25 MG PO CAPS: 25.0000 mg | ORAL_CAPSULE | Freq: Four times a day (QID) | ORAL | Status: DC | PRN

## 2020-12-27 MED ORDER — BUPIVACAINE IN DEXTROSE 0.75-8.25 % IT SOLN
INTRATHECAL | Status: DC | PRN
  Administered 2020-12-27: 1.6 mL via INTRATHECAL

## 2020-12-27 MED ORDER — MORPHINE SULFATE (PF) 0.5 MG/ML IJ SOLN
INTRAMUSCULAR | Status: DC | PRN
  Administered 2020-12-27: 150 ug via INTRATHECAL

## 2020-12-27 MED ORDER — ACETAMINOPHEN 160 MG/5ML PO SOLN: 1000.0000 mg | Freq: Once | ORAL | Status: DC

## 2020-12-27 MED ORDER — FENTANYL CITRATE (PF) 100 MCG/2ML IJ SOLN
INTRAMUSCULAR | Status: DC | PRN
  Administered 2020-12-27: 15 ug via INTRATHECAL

## 2020-12-27 MED ORDER — SODIUM CHLORIDE 0.9 % IR SOLN
Status: DC | PRN
  Administered 2020-12-27: 1000 mL

## 2020-12-27 MED ORDER — LACTATED RINGERS IV SOLN
125.0000 mL/h | INTRAVENOUS | Status: DC
  Administered 2020-12-27: 125 mL/h via INTRAVENOUS

## 2020-12-27 MED ORDER — KETOROLAC TROMETHAMINE 30 MG/ML IJ SOLN
30.0000 mg | Freq: Once | INTRAMUSCULAR | Status: AC
  Administered 2020-12-27: 30 mg via INTRAVENOUS

## 2020-12-27 MED ORDER — ONDANSETRON HCL 4 MG/2ML IJ SOLN: 4.0000 mg | Freq: Three times a day (TID) | INTRAMUSCULAR | Status: DC | PRN

## 2020-12-27 MED ORDER — MEDROXYPROGESTERONE ACETATE 150 MG/ML IM SUSP: 150.0000 mg | INTRAMUSCULAR | Status: DC | PRN

## 2020-12-27 MED ORDER — PHENYLEPHRINE HCL-NACL 20-0.9 MG/250ML-% IV SOLN
INTRAVENOUS | Status: DC | PRN
  Administered 2020-12-27: 60 ug/min via INTRAVENOUS

## 2020-12-27 MED ORDER — TETANUS-DIPHTH-ACELL PERTUSSIS 5-2.5-18.5 LF-MCG/0.5 IM SUSY: 0.5000 mL | PREFILLED_SYRINGE | Freq: Once | INTRAMUSCULAR | Status: DC

## 2020-12-27 MED ORDER — FENTANYL CITRATE (PF) 100 MCG/2ML IJ SOLN
INTRAMUSCULAR | Status: AC
  Filled 2020-12-27: qty 2

## 2020-12-27 MED ORDER — CEFAZOLIN IN SODIUM CHLORIDE 3-0.9 GM/100ML-% IV SOLN
3.0000 g | INTRAVENOUS | Status: AC
  Administered 2020-12-27: 3 g via INTRAVENOUS

## 2020-12-27 MED ORDER — FENTANYL CITRATE (PF) 100 MCG/2ML IJ SOLN: 25.0000 ug | INTRAMUSCULAR | Status: DC | PRN

## 2020-12-27 MED ORDER — OXYTOCIN-SODIUM CHLORIDE 30-0.9 UT/500ML-% IV SOLN
INTRAVENOUS | Status: DC | PRN
  Administered 2020-12-27: 300 mL via INTRAVENOUS

## 2020-12-27 MED ORDER — MENTHOL 3 MG MT LOZG: 1.0000 | LOZENGE | OROMUCOSAL | Status: DC | PRN

## 2020-12-27 MED ORDER — IBUPROFEN 600 MG PO TABS
600.0000 mg | ORAL_TABLET | Freq: Four times a day (QID) | ORAL | Status: DC
  Administered 2020-12-28 – 2020-12-30 (×8): 600 mg via ORAL
  Filled 2020-12-27 (×8): qty 1

## 2020-12-27 MED ORDER — NALOXONE HCL 4 MG/10ML IJ SOLN
1.0000 ug/kg/h | INTRAVENOUS | Status: DC | PRN
  Filled 2020-12-27: qty 5

## 2020-12-27 MED ORDER — LACTATED RINGERS IV SOLN: INTRAVENOUS | Status: DC

## 2020-12-27 MED ORDER — NALOXONE HCL 0.4 MG/ML IJ SOLN: 0.4000 mg | INTRAMUSCULAR | Status: DC | PRN

## 2020-12-27 MED ORDER — OXYCODONE HCL 5 MG PO TABS: 5.0000 mg | ORAL_TABLET | ORAL | Status: DC | PRN

## 2020-12-27 MED ORDER — ONDANSETRON HCL 4 MG/2ML IJ SOLN
INTRAMUSCULAR | Status: DC | PRN
  Administered 2020-12-27: 4 mg via INTRAVENOUS

## 2020-12-27 MED ORDER — DIPHENHYDRAMINE HCL 50 MG/ML IJ SOLN: 12.5000 mg | Freq: Four times a day (QID) | INTRAMUSCULAR | Status: DC | PRN

## 2020-12-27 MED ORDER — PHENYLEPHRINE 40 MCG/ML (10ML) SYRINGE FOR IV PUSH (FOR BLOOD PRESSURE SUPPORT)
PREFILLED_SYRINGE | INTRAVENOUS | Status: DC | PRN
  Administered 2020-12-27 (×2): 120 ug via INTRAVENOUS
  Administered 2020-12-27: 160 ug via INTRAVENOUS

## 2020-12-27 MED ORDER — WITCH HAZEL-GLYCERIN EX PADS: 1.0000 "application " | MEDICATED_PAD | CUTANEOUS | Status: DC | PRN

## 2020-12-27 MED ORDER — KETOROLAC TROMETHAMINE 30 MG/ML IJ SOLN
30.0000 mg | Freq: Four times a day (QID) | INTRAMUSCULAR | Status: AC
Start: 2020-12-27 — End: 2020-12-28
  Administered 2020-12-27 – 2020-12-28 (×3): 30 mg via INTRAVENOUS
  Filled 2020-12-27 (×3): qty 1

## 2020-12-27 MED ORDER — ACETAMINOPHEN 500 MG PO TABS: 1000.0000 mg | ORAL_TABLET | Freq: Four times a day (QID) | ORAL | Status: DC

## 2020-12-27 MED ORDER — MEASLES, MUMPS & RUBELLA VAC IJ SOLR: 0.5000 mL | Freq: Once | INTRAMUSCULAR | Status: DC

## 2020-12-27 MED ORDER — DEXAMETHASONE SODIUM PHOSPHATE 10 MG/ML IJ SOLN
INTRAMUSCULAR | Status: DC | PRN
  Administered 2020-12-27: 10 mg via INTRAVENOUS

## 2020-12-27 MED ORDER — NALBUPHINE HCL 10 MG/ML IJ SOLN: 5.0000 mg | Freq: Once | INTRAMUSCULAR | Status: DC | PRN

## 2020-12-27 MED ORDER — SENNOSIDES-DOCUSATE SODIUM 8.6-50 MG PO TABS
2.0000 | ORAL_TABLET | Freq: Every day | ORAL | Status: DC
  Administered 2020-12-28 – 2020-12-30 (×3): 2 via ORAL
  Filled 2020-12-27 (×3): qty 2

## 2020-12-27 MED ORDER — ACETAMINOPHEN 500 MG PO TABS
1000.0000 mg | ORAL_TABLET | Freq: Four times a day (QID) | ORAL | Status: DC
  Administered 2020-12-27 – 2020-12-30 (×12): 1000 mg via ORAL
  Filled 2020-12-27 (×12): qty 2

## 2020-12-27 MED ORDER — OXYTOCIN-SODIUM CHLORIDE 30-0.9 UT/500ML-% IV SOLN: INTRAVENOUS | Status: DC | PRN

## 2020-12-27 MED ORDER — KETOROLAC TROMETHAMINE 30 MG/ML IJ SOLN: 30.0000 mg | Freq: Four times a day (QID) | INTRAMUSCULAR | Status: DC | PRN

## 2020-12-27 MED ORDER — SIMETHICONE 80 MG PO CHEW
80.0000 mg | CHEWABLE_TABLET | Freq: Three times a day (TID) | ORAL | Status: DC
  Administered 2020-12-28 – 2020-12-30 (×6): 80 mg via ORAL
  Filled 2020-12-27 (×7): qty 1

## 2020-12-27 MED ORDER — OXYTOCIN-SODIUM CHLORIDE 30-0.9 UT/500ML-% IV SOLN
2.5000 [IU]/h | INTRAVENOUS | Status: AC
  Administered 2020-12-27: 2.5 [IU]/h via INTRAVENOUS
  Filled 2020-12-27: qty 500

## 2020-12-27 MED ORDER — ENOXAPARIN SODIUM 80 MG/0.8ML IJ SOSY
70.0000 mg | PREFILLED_SYRINGE | INTRAMUSCULAR | Status: DC
  Administered 2020-12-28 – 2020-12-30 (×3): 70 mg via SUBCUTANEOUS
  Filled 2020-12-27 (×3): qty 0.8

## 2020-12-27 MED ORDER — PHENYLEPHRINE 40 MCG/ML (10ML) SYRINGE FOR IV PUSH (FOR BLOOD PRESSURE SUPPORT)
PREFILLED_SYRINGE | INTRAVENOUS | Status: AC
  Filled 2020-12-27: qty 10

## 2020-12-27 MED ORDER — COCONUT OIL OIL: 1.0000 "application " | TOPICAL_OIL | Status: DC | PRN

## 2020-12-27 SURGICAL SUPPLY — 38 items
ADH SKN CLS APL DERMABOND .7 (GAUZE/BANDAGES/DRESSINGS) ×1
APL SKNCLS STERI-STRIP NONHPOA (GAUZE/BANDAGES/DRESSINGS) ×1
BENZOIN TINCTURE PRP APPL 2/3 (GAUZE/BANDAGES/DRESSINGS) ×2 IMPLANT
CHLORAPREP W/TINT 26ML (MISCELLANEOUS) ×2 IMPLANT
CLAMP CORD UMBIL (MISCELLANEOUS) IMPLANT
CLOTH BEACON ORANGE TIMEOUT ST (SAFETY) ×2 IMPLANT
DERMABOND ADVANCED (GAUZE/BANDAGES/DRESSINGS) ×1
DERMABOND ADVANCED .7 DNX12 (GAUZE/BANDAGES/DRESSINGS) IMPLANT
DRSG OPSITE POSTOP 4X10 (GAUZE/BANDAGES/DRESSINGS) ×2 IMPLANT
ELECT REM PT RETURN 9FT ADLT (ELECTROSURGICAL) ×2
ELECTRODE REM PT RTRN 9FT ADLT (ELECTROSURGICAL) ×1 IMPLANT
EXTRACTOR VACUUM M CUP 4 TUBE (SUCTIONS) IMPLANT
GLOVE BIOGEL PI IND STRL 7.0 (GLOVE) ×2 IMPLANT
GLOVE BIOGEL PI IND STRL 7.5 (GLOVE) ×2 IMPLANT
GLOVE BIOGEL PI INDICATOR 7.0 (GLOVE) ×2
GLOVE BIOGEL PI INDICATOR 7.5 (GLOVE) ×2
GLOVE ECLIPSE 7.5 STRL STRAW (GLOVE) ×2 IMPLANT
GOWN STRL REUS W/TWL LRG LVL3 (GOWN DISPOSABLE) ×6 IMPLANT
HOVERMATT SINGLE USE (MISCELLANEOUS) ×1 IMPLANT
KIT ABG SYR 3ML LUER SLIP (SYRINGE) IMPLANT
NDL HYPO 25X5/8 SAFETYGLIDE (NEEDLE) IMPLANT
NEEDLE HYPO 25X5/8 SAFETYGLIDE (NEEDLE) IMPLANT
NS IRRIG 1000ML POUR BTL (IV SOLUTION) ×2 IMPLANT
PACK C SECTION WH (CUSTOM PROCEDURE TRAY) ×2 IMPLANT
PAD OB MATERNITY 4.3X12.25 (PERSONAL CARE ITEMS) ×2 IMPLANT
PENCIL SMOKE EVAC W/HOLSTER (ELECTROSURGICAL) ×2 IMPLANT
RTRCTR C-SECT PINK 25CM LRG (MISCELLANEOUS) ×2 IMPLANT
STRIP CLOSURE SKIN 1/2X4 (GAUZE/BANDAGES/DRESSINGS) ×2 IMPLANT
SUT PLAIN 2 0 (SUTURE) ×2
SUT PLAIN ABS 2-0 CT1 27XMFL (SUTURE) IMPLANT
SUT VIC AB 0 CTX 36 (SUTURE) ×6
SUT VIC AB 0 CTX36XBRD ANBCTRL (SUTURE) ×3 IMPLANT
SUT VIC AB 2-0 CT1 27 (SUTURE) ×2
SUT VIC AB 2-0 CT1 TAPERPNT 27 (SUTURE) ×1 IMPLANT
SUT VIC AB 4-0 KS 27 (SUTURE) ×2 IMPLANT
TOWEL OR 17X24 6PK STRL BLUE (TOWEL DISPOSABLE) ×2 IMPLANT
TRAY FOLEY W/BAG SLVR 14FR LF (SET/KITS/TRAYS/PACK) ×2 IMPLANT
WATER STERILE IRR 1000ML POUR (IV SOLUTION) ×2 IMPLANT

## 2020-12-27 NOTE — Lactation Note (Signed)
This note was copied from a baby's chart. Lactation Consultation Note  Patient Name: Brittney Salazar [id number] Date: 12/27/2020 Reason for consult: Initial assessment;1st time breastfeeding;Term;Maternal endocrine disorder (C/S delivery) Age:36 hours Per mom, she has taken breastfeeding classes in her pregnancy. Mom feels breastfeeding is going well, this is her third time latching infant at the breast, most feedings are 10 minutes in length. Mom latched infant on her left breast using the football hold position, infant latched with depth, SSB response observed, Infant breastfeed for 18 minutes. Mom was taught hand expression and infant was given 2 mls of colostrum by spoon. Mom knows to ask RN or LC for assistance with latching infant at the breast if needed. Mom will continue to breastfeed infant according to hunger cues, 8 to 12+ or more times within 24 hours, skin to skin. LC discussed infant's input and output. Mom made aware of O/P services, breastfeeding support groups, community resources, and our phone # for post-discharge questions.   Maternal Data Has patient been taught Hand Expression?: Yes Does the patient have breastfeeding experience prior to this delivery?: No  Feeding Mother's Current Feeding Choice: Breast Milk  LATCH Score Latch: Grasps breast easily, tongue down, lips flanged, rhythmical sucking.  Audible Swallowing: Spontaneous and intermittent  Type of Nipple: Everted at rest and after stimulation  Comfort (Breast/Nipple): Soft / non-tender  Hold (Positioning): Assistance needed to correctly position infant at breast and maintain latch.  LATCH Score: 9   Lactation Tools Discussed/Used    Interventions Interventions: Breast feeding basics reviewed;Breast compression;Assisted with latch;Adjust position;Skin to skin;Support pillows;Breast massage;Position options;Education;Expressed milk;Hand express  Discharge Pump: Personal (Mom has DEBP at home.) [facility]  Program: No  Consult Status Consult Status: Follow-up Date: 12/28/20 Follow-up type: In-patient    Danelle Earthly 12/27/2020, 6:18 PM

## 2020-12-27 NOTE — Anesthesia Preprocedure Evaluation (Addendum)
Anesthesia Evaluation  Patient identified by MRN, date of birth, ID band Patient awake    Reviewed: Allergy & Precautions, NPO status , Patient's Chart, lab work & pertinent test results  History of Anesthesia Complications Negative for: history of anesthetic complications  Airway Mallampati: II  TM Distance: >3 FB Neck ROM: Full    Dental no notable dental hx.    Pulmonary former smoker,    Pulmonary exam normal        Cardiovascular negative cardio ROS Normal cardiovascular exam     Neuro/Psych negative neurological ROS  negative psych ROS   GI/Hepatic negative GI ROS, Neg liver ROS,   Endo/Other  Hypothyroidism Morbid obesity (BMI 48)  Renal/GU negative Renal ROS  negative genitourinary   Musculoskeletal negative musculoskeletal ROS (+)   Abdominal   Peds  Hematology negative hematology ROS (+)   Anesthesia Other Findings Day of surgery medications reviewed with patient.  Reproductive/Obstetrics (+) Pregnancy                            Anesthesia Physical Anesthesia Plan  ASA: 3  Anesthesia Plan: Spinal   Post-op Pain Management:    Induction:   PONV Risk Score and Plan: 4 or greater and Treatment may vary due to age or medical condition, Ondansetron and Dexamethasone  Airway Management Planned: Natural Airway  Additional Equipment: None  Intra-op Plan:   Post-operative Plan:   Informed Consent: I have reviewed the patients History and Physical, chart, labs and discussed the procedure including the risks, benefits and alternatives for the proposed anesthesia with the patient or authorized representative who has indicated his/her understanding and acceptance.       Plan Discussed with: CRNA  Anesthesia Plan Comments:        Anesthesia Quick Evaluation

## 2020-12-27 NOTE — Op Note (Signed)
Brittney Salazar PROCEDURE DATE: 12/27/2020  PREOPERATIVE DIAGNOSIS: Intrauterine pregnancy at  [redacted]w[redacted]d weeks gestation; malpresentation: Breech  POSTOPERATIVE DIAGNOSIS: The same  PROCEDURE: Primary Low Transverse Cesarean Section  SURGEON:  Dr. Candelaria Celeste  ASSISTANT: Dr Leticia Penna   INDICATIONS: Brittney Salazar is a 35 y.o. G1P1001 at [redacted]w[redacted]d scheduled for cesarean section secondary to malpresentation: Breech .  The risks of cesarean section discussed with the patient included but were not limited to: bleeding which may require transfusion or reoperation; infection which may require antibiotics; injury to bowel, bladder, ureters or other surrounding organs; injury to the fetus; need for additional procedures including hysterectomy in the event of a life-threatening hemorrhage; placental abnormalities wth subsequent pregnancies, incisional problems, thromboembolic phenomenon and other postoperative/anesthesia complications. The patient concurred with the proposed plan, giving informed written consent for the procedure.    FINDINGS:  Viable female infant in Breech presentation.  Apgars 9 and 9, weight, pending.  Clear amniotic fluid.  Intact placenta, three vessel cord.  Normal uterus, fallopian tubes and ovaries bilaterally.  ANESTHESIA:    Spinal INTRAVENOUS FLUIDS: 1000 ml ESTIMATED BLOOD LOSS: 596 ml URINE OUTPUT:  100 ml SPECIMENS: Placenta to take with mother  COMPLICATIONS: None immediate  PROCEDURE IN DETAIL:  The patient received intravenous antibiotics and had sequential compression devices applied to her lower extremities while in the preoperative area.  She was then taken to the operating room where spinal anesthesia was administered (epidural anesthesia was dosed up to surgical level) and was found to be adequate. She was then placed in a dorsal supine position with a leftward tilt, and prepped and draped in a sterile manner.  A foley catheter was placed into her bladder and  attached to constant gravity, which drained clear fluid throughout.  After an adequate timeout was performed, a Pfannenstiel skin incision was made with scalpel and carried through to the underlying layer of fascia. The fascia was incised in the midline and this incision was extended bilaterally using the Mayo scissors. Kocher clamps were applied to the superior aspect of the fascial incision and the underlying rectus muscles were dissected off sharply with mayo scissors.The rectus muscles were separated in the midline bluntly and the peritoneum was entered bluntly. An Alexis retractor was placed to aid in visualization of the uterus.  Attention was turned to the lower uterine segment where a transverse hysterotomy was made with a scalpel and extended bilaterally bluntly. The infant was successfully delivered feet first, and cord was clamped and cut and infant was handed over to awaiting neonatology team. Uterine massage was then administered and the placenta delivered intact with three-vessel cord. The uterus was then cleared of clot and debris.  The hysterotomy was closed with 0 Vicryl in a running locked fashion, and an imbricating layer was also placed with a 0 Vicryl. Overall, excellent hemostasis was noted. The abdomen and the pelvis were cleared of all clot and debris and the Jon Gills was removed. Hemostasis was confirmed on all surfaces.  The peritoneum was reapproximated using 2-0 vicryl running stitches. The fascia was then closed using 0 Vicryl in a running fashion. The subcutaneous layer was reapproximated with plain gut and the skin was closed with 4-0 vicryl. The patient tolerated the procedure well. Sponge, lap, instrument and needle counts were correct x 2. She was taken to the recovery room in stable condition.    Allayne Stack, DO 12/27/2020 2:14 PM

## 2020-12-27 NOTE — Discharge Summary (Signed)
Postpartum Discharge Summary  Date of Service updated     Patient Name: Brittney Salazar DOB: April 08, 1986 MRN: [id number]  Date of admission: 12/27/2020 Delivery date:12/27/2020  Delivering provider: Truett Mainland  Date of discharge: 12/30/2020  Admitting diagnosis: Supervision of high risk pregnancy, antepartum [O09.90] Intrauterine pregnancy: [redacted]w[redacted]d    Secondary diagnosis:  Principal Problem:   Cesarean delivery delivered Active Problems:   Maternal morbid obesity, antepartum ([facility]   Alpha thalassemia silent carrier   IUI Pregnancy   Velamentous insertion of umbilical cord, antepartum   History of hypothyroidism   Multigravida of advanced maternal age in third trimester   COVID-19 affecting pregnancy in third trimester   Malpresentation before onset of labor  Additional problems: None    Discharge diagnosis: Term Pregnancy Delivered                                              Post partum procedures: n/a Augmentation: N/A Complications: None  Hospital course: Sceduled C/S   35y.o. yo G1P1001 at 335w0das admitted to the hospital 12/27/2020 for scheduled cesarean section with the following indication:Malpresentation.Delivery details are as follows:  Membrane Rupture Time/Date: 1:09 PM ,12/27/2020   Delivery Method:C-Section, Low Transverse  Details of operation can be found in separate operative note.  Patient had an uncomplicated postpartum course.  She is ambulating, tolerating a regular diet, passing flatus, and urinating well. Patient is discharged home in stable condition on  12/30/20        Newborn Data: Birth date:12/27/2020  Birth time:1:11 PM  Gender:Female  Living status:Living  Apgars:9 ,10  Weight:3510 g     Magnesium Sulfate received: No BMZ received: No Rhophylac: No MMR: No T-DaP: Given prenatally Flu: No Transfusion: No  Physical exam  Vitals:   12/29/20 0626 12/29/20 1409 12/29/20 2124 12/30/20 0540  BP: 126/74 125/72 140/83 135/78  Pulse: 79 88 89  (!) 56  Resp: _0 Temp: 98.1 F (36.7 C) 98.2 F (36.8 C) 98.4 F (36.9 C) 98.5 F (36.9 C)  TempSrc: Oral Oral Oral Oral  SpO2: 100% 100% 100% 100%  Weight:      Height:       General: alert, cooperative, and no distress Lochia: appropriate Uterine Fundus: firm Incision: Healing well with no significant drainage, No significant erythema, Dressing is clean, dry, and intact DVT Evaluation: No evidence of DVT seen on physical exam.  Labs: Lab Results  Component Value Date   WBC 10.2 12/28/2020   HGB 8.3 (L) 12/28/2020   HCT 24.8 (L) 12/28/2020   MCV 86.4 12/28/2020   PLT PLATELET CLUMPS NOTED ON SMEAR, UNABLE TO ESTIMATE 12/28/2020   CMP Latest Ref Rng & Units 06/15/2020  Glucose 65 - 99 mg/dL 80  BUN 6 - 20 mg/dL 11  Creatinine 0.57 - 1.00 mg/dL 0.81  Sodium 134 - 144 mmol/L 138  Potassium 3.5 - 5.2 mmol/L 4.1  Chloride 96 - 106 mmol/L 104  CO2 20 - 29 mmol/L 19(L)  Calcium 8.7 - 10.2 mg/dL 9.7  Total Protein 6.0 - 8.5 g/dL 7.0  Total Bilirubin 0.0 - 1.2 mg/dL 0.3  Alkaline Phos 44 - 121 IU/L 56  AST 0 - 40 IU/L 13  ALT 0 - 32 IU/L 8   Edinburgh Score: Edinburgh Postnatal Depression Scale Screening Tool 12/27/2020  I have been  able to laugh and see the funny side of things. 0  I have looked forward with enjoyment to things. 0  I have blamed myself unnecessarily when things went wrong. 0  I have been anxious or worried for no good reason. 0  I have felt scared or panicky for no good reason. 0  Things have been getting on top of me. 0  I have been so unhappy that I have had difficulty sleeping. 0  I have felt sad or miserable. 0  I have been so unhappy that I have been crying. 0  The thought of harming myself has occurred to me. 0  Edinburgh Postnatal Depression Scale Total 0     After visit meds:  Allergies as of 12/30/2020       Reactions   Ciprofloxacin Hives        Medication List     STOP taking these medications    aspirin EC 81 MG  tablet   calcium carbonate 500 MG chewable tablet Commonly known as: TUMS - dosed in mg elemental calcium       TAKE these medications    albuterol 108 (90 Base) MCG/ACT inhaler Commonly known as: VENTOLIN HFA Inhale 1-2 puffs into the lungs every 6 (six) hours as needed for wheezing or shortness of breath.   docusate sodium 100 MG capsule Commonly known as: COLACE Take 1 capsule (100 mg total) by mouth 2 (two) times daily as needed. What changed: when to take this   ferrous sulfate 325 (65 FE) MG tablet Commonly known as: FerrouSul Take 1 tablet (325 mg total) by mouth every other day.   Flovent HFA 44 MCG/ACT inhaler Generic drug: fluticasone Inhale 2 puffs into the lungs 2 (two) times daily.   levothyroxine 50 MCG tablet Commonly known as: SYNTHROID Take 50 mcg by mouth daily before breakfast. Take 100 mcg by mouth on Tuesday and Saturday, take 50 mcg on Monday, Wednesday, Thursday, Friday and Sunday   oxyCODONE 5 MG immediate release tablet Commonly known as: Oxy IR/ROXICODONE Take 1-2 tablets (5-10 mg total) by mouth every 4 (four) hours as needed for moderate pain.   PRENATAL GUMMIES PO Take 2 tablets by mouth daily.   valACYclovir 1000 MG tablet Commonly known as: Valtrex Take 0.5 tablets (500 mg total) by mouth 2 (two) times daily.         Discharge home in stable condition Infant Feeding: Breast Infant Disposition: home with mother Discharge instruction: per After Visit Summary and Postpartum booklet. Activity: Advance as tolerated. Pelvic rest for 6 weeks.  Diet: routine diet Future Appointments: Future Appointments  Date Time Provider [place]  01/04/2021 10:00 AM [facility] NURSE [facility]  01/26/2021 11:15 AM Donnamae Jude, MD [facility]   Follow up Visit: Message sent by Dr Higinio Plan on 12/27/2020 to [facility]   Please schedule this patient for a In person postpartum visit in 4 weeks with the following provider: Any  provider. Additional Postpartum F/U: Incision check 1 week  Low risk pregnancy complicated by: IUI pregnancy, Breech  Delivery mode:  C-Section, Low Transverse  Anticipated Birth Control:  Unsure    12/30/2020 Renee Harder, CNM

## 2020-12-27 NOTE — H&P (Signed)
Obstetric Preoperative History and Physical  Brittney Salazar is a 35 y.o. G1P0000 with IUP at [redacted]w[redacted]d presenting for scheduled cesarean section due to breech presentation.  No acute concerns.   Prenatal Course Source of Care: [place] with onset of care at 11 weeks Pregnancy complications or risks:  --PCOS --Breech presentation (EFW 59% at 36 weeks)  --IUI pregnancy  --Velamentous cord insertion without previa  --COVID 19 infection without hospitalization in 10/2020 --Alpha thal silent carrier Patient Active Problem List   Diagnosis Date Noted   Malpresentation before onset of labor 12/14/2020   COVID-19 affecting pregnancy in third trimester 11/10/2020   Third trimester bleeding, antepartum 10/22/2020   History of ectopic pregnancy 10/13/2020   IUI Pregnancy 10/13/2020   Velamentous insertion of umbilical cord, antepartum 10/13/2020   History of hypothyroidism 10/13/2020   Multigravida of advanced maternal age in third trimester 10/13/2020   Alpha thalassemia silent carrier 07/01/2020   Supervision of high risk pregnancy, antepartum 06/15/2020   Maternal morbid obesity, antepartum (H[facility]) 06/15/2020   BMI 40.0-44.9, adult (HCC) 08/09/2019   Infertility associated with anovulation 08/07/2011   Polycystic ovarian syndrome 08/07/2011   Elevated testosterone level in female 08/07/2011   Vitamin D deficiency 08/07/2011   Hirsutism 08/07/2011   She plans to breastfeed She desires no method for postpartum contraception.   Prenatal labs and studies: ABO, Rh: --/--/O POS (09/04 0831) Antibody: NEG (09/04 0831) Rubella: 2.70 (02/23 0949) RPR: NON REACTIVE (09/04 0831)  HBsAg: Negative (02/23 0949)  HIV: NON REACTIVE (09/04 0831)  QXI:HWTUUEKC/-- (08/18 0918) 2 hr Glucola  passed Genetic screening normal (alpha thal carrier known)  Anatomy US normal  Prenatal Transfer Tool  Maternal Diabetes: No Genetic Screening: Normal Maternal Ultrasounds/Referrals: Normal Fetal Ultrasounds or  other Referrals:  None Maternal Substance Abuse:  No Significant Maternal Medications:  None Significant Maternal Lab Results: None  Past Medical History:  Diagnosis Date   Hx of metrorrhagia    Hypothyroidism    Left tubal pregnancy with intrauterine pregnancy 05/18/2020   S/p left salpingectomy    Past Surgical History:  Procedure Laterality Date   SALPINGECTOMY Left    WISDOM TOOTH EXTRACTION      OB History  Gravida Para Term Preterm AB Living  1 0 0 0 0    SAB IAB Ectopic Multiple Live Births  0 0 0        # Outcome Date GA Lbr Len/2nd Weight Sex Delivery Anes PTL Lv  1 Current             Social History   Socioeconomic History   Marital status: Married    Spouse name: Not on file   Number of children: Not on file   Years of education: Not on file   Highest education level: Not on file  Occupational History   Not on file  Tobacco Use   Smoking status: Former    Packs/day: 0.25    Types: Cigarettes   Smokeless tobacco: Never  Vaping Use   Vaping Use: Never used  Substance and Sexual Activity   Alcohol use: No   Drug use: No   Sexual activity: Yes    Birth control/protection: None  Other Topics Concern   Not on file  Social History Narrative   ** Merged History Encounter **       Social Determinants of Health   Financial Resource Strain: Not on file  Food Insecurity: Food Insecurity Present   Worried About Running Out of Food in the  Last Year: Sometimes true   Ran Out of Food in the Last Year: Never true  Transportation Needs: No Transportation Needs   Lack of Transportation (Medical): No   Lack of Transportation (Non-Medical): No  Physical Activity: Not on file  Stress: Not on file  Social Connections: Not on file    Family History  Problem Relation Age of Onset   Hypertension Mother    Arthritis Father     Medications Prior to Admission  Medication Sig Dispense Refill Last Dose   albuterol (VENTOLIN HFA) 108 (90 Base) MCG/ACT inhaler  Inhale 1-2 puffs into the lungs every 6 (six) hours as needed for wheezing or shortness of breath.      aspirin EC 81 MG tablet Take 1 tablet (81 mg total) by mouth daily. Take after 12 weeks for prevention of preeclampsia later in pregnancy 300 tablet 2 Past Week   calcium carbonate (TUMS - DOSED IN MG ELEMENTAL CALCIUM) 500 MG chewable tablet Chew 1 tablet by mouth daily as needed for indigestion or heartburn. (Patient not taking: Reported on 12/22/2020)      docusate sodium (COLACE) 100 MG capsule Take 1 capsule (100 mg total) by mouth 2 (two) times daily as needed. (Patient taking differently: Take 100 mg by mouth daily.) 40 capsule 0    ferrous sulfate (FERROUSUL) 325 (65 FE) MG tablet Take 1 tablet (325 mg total) by mouth every other day. 50 tablet 0 12/26/2020   FLOVENT HFA 44 MCG/ACT inhaler Inhale 2 puffs into the lungs 2 (two) times daily.      levothyroxine (SYNTHROID) 50 MCG tablet Take 50 mcg by mouth daily before breakfast. Take 100 mcg by mouth on Tuesday and Saturday, take 50 mcg on Monday, Wednesday, Thursday, Friday and Sunday   12/27/2020   Prenatal MV & Min w/FA-DHA (PRENATAL GUMMIES PO) Take 2 tablets by mouth daily.   12/26/2020   valACYclovir (VALTREX) 1000 MG tablet Take 0.5 tablets (500 mg total) by mouth 2 (two) times daily. (Patient not taking: No sig reported) 60 tablet 0     Allergies  Allergen Reactions   Ciprofloxacin Hives    Review of Systems: Negative except for what is mentioned in HPI.  Physical Exam: BP 129/78   Pulse 98   Temp 98.2 F (36.8 C) (Oral)   Resp 18   Ht 5\' 5"  (1.651 m)   Wt 130.5 kg   SpO2 100%   BMI 47.86 kg/m  FHR by Doppler: 147 bpm CONSTITUTIONAL: Well-developed, well-nourished female in no acute distress.  HENT:  Normocephalic, atraumatic.  EYES: Conjunctivae and EOM are normal. No scleral icterus.  NECK: Normal range of motion, supple SKIN: Skin is warm and dry. No rash noted. NEUROLGIC: Alert and oriented to person, place, and time.   PSYCHIATRIC: Normal mood and affect. Normal behavior. CARDIOVASCULAR: Normal heart rate noted RESPIRATORY: Effort normal, no problems with respiration noted ABDOMEN: Soft, gravid.  PELVIC: Deferred MUSCULOSKELETAL: Normal range of motion.  Pertinent Labs/Studies:   Results for orders placed or performed during the hospital encounter of 12/27/20 (from the past 72 hour(s))  CBC     Status: Abnormal   Collection Time: 12/25/20  8:31 AM  Result Value Ref Range   WBC 5.1 4.0 - 10.5 K/uL   RBC 3.88 3.87 - 5.11 MIL/uL   Hemoglobin 11.2 (L) 12.0 - 15.0 g/dL   HCT 02/24/21 (L) 47.8 - 29.5 %   MCV 86.6 80.0 - 100.0 fL   MCH 28.9 26.0 - 34.0 pg  MCHC 33.3 30.0 - 36.0 g/dL   RDW 68.4 03.3 - 53.3 %   Platelets 126 (L) 150 - 400 K/uL    Comment: REPEATED TO VERIFY   nRBC 0.0 0.0 - 0.2 %    Comment: Performed at [facility], [address] [place], [place] [address]  RPR     Status: None   Collection Time: 12/25/20  8:31 AM  Result Value Ref Range   RPR Ser Ql NON REACTIVE NON REACTIVE    Comment: Performed at [facility], [address] [place], [place] [address]  Rapid HIV screen (HIV 1/2 Ab+Ag)     Status: None   Collection Time: 12/25/20  8:31 AM  Result Value Ref Range   HIV-1 P24 Antigen - HIV24 NON REACTIVE NON REACTIVE    Comment: (NOTE) Detection of p24 may be inhibited by biotin in the sample, causing false negative results in acute infection.    HIV 1/2 Antibodies NON REACTIVE NON REACTIVE   Interpretation (HIV Ag Ab)      A non reactive test result means that HIV 1 or HIV 2 antibodies and HIV 1 p24 antigen were not detected in the specimen.    Comment: Performed at [facility], [address], [place], [place] [address]  Type and screen     Status: None   Collection Time: 12/25/20  8:31 AM  Result Value Ref Range   ABO/RH(D) O POS    Antibody Screen NEG    Sample Expiration      12/28/2020,2359 Performed at [facility], [address],  [place], [place] [address]     Assessment and Plan :JASIYA MARKIE is a 35 y.o. G1P0000 at [redacted]w[redacted]d being admitted for scheduled cesarean section. The risks of cesarean section discussed with the patient included but were not limited to: bleeding which may require transfusion or reoperation; infection which may require antibiotics; injury to bowel, bladder, ureters or other surrounding organs; injury to the fetus; need for additional procedures including hysterectomy in the event of a life-threatening hemorrhage; placental abnormalities wth subsequent pregnancies, incisional problems, thromboembolic phenomenon and other postoperative/anesthesia complications. The patient concurred with the proposed plan, giving informed written consent for the procedure. Patient has been NPO since last night she will remain NPO for procedure. Anesthesia and OR aware. Preoperative prophylactic antibiotics and SCDs ordered on call to the OR. To OR when ready.    Leticia Penna, DO  OB Fellow  12/27/2020, 12:30 PM

## 2020-12-27 NOTE — Transfer of Care (Signed)
Immediate Anesthesia Transfer of Care Note  Patient: Brittney Salazar  Procedure(s) Performed: CESAREAN SECTION  Patient Location: PACU  Anesthesia Type:Spinal  Level of Consciousness: awake, alert  and oriented  Airway & Oxygen Therapy: Patient Spontanous Breathing  Post-op Assessment: Report given to RN and Post -op Vital signs reviewed and stable  Post vital signs: Reviewed and stable  Last Vitals:  Vitals Value Taken Time  BP 111/64 12/27/20 1415  Temp    Pulse 80 12/27/20 1416  Resp 16 12/27/20 1416  SpO2 100 % 12/27/20 1416  Vitals shown include unvalidated device data.  Last Pain:  Vitals:   12/27/20 1124  TempSrc: Oral         Complications: No notable events documented.

## 2020-12-27 NOTE — Anesthesia Procedure Notes (Signed)
Spinal  Patient location during procedure: OR Start time: 12/27/2020 12:38 PM End time: 12/27/2020 12:41 PM Reason for block: surgical anesthesia Staffing Performed: anesthesiologist  Anesthesiologist: Kaylyn Layer, MD Preanesthetic Checklist Completed: patient identified, IV checked, risks and benefits discussed, monitors and equipment checked, pre-op evaluation and timeout performed Spinal Block Patient position: sitting Prep: DuraPrep and site prepped and draped Patient monitoring: heart rate, continuous pulse ox and blood pressure Approach: midline Location: L3-4 Injection technique: single-shot Needle Needle type: Pencan  Needle gauge: 24 G Needle length: 10 cm Assessment Sensory level: T4 Events: CSF return Additional Notes Risks, benefits, and alternative discussed. Patient gave consent to procedure. Prepped and draped in sitting position. Clear CSF obtained after one needle redirection. Positive terminal aspiration. No pain or paraesthesias with injection. Patient tolerated procedure well. Vital signs stable. Amalia Greenhouse, MD

## 2020-12-27 NOTE — Anesthesia Postprocedure Evaluation (Signed)
Anesthesia Post Note  Patient: Brittney Salazar  Procedure(s) Performed: CESAREAN SECTION     Patient location during evaluation: PACU Anesthesia Type: Spinal Level of consciousness: awake and alert and oriented Pain management: pain level controlled Vital Signs Assessment: post-procedure vital signs reviewed and stable Respiratory status: spontaneous breathing, nonlabored ventilation and respiratory function stable Cardiovascular status: blood pressure returned to baseline Postop Assessment: no apparent nausea or vomiting, spinal receding, no headache and no backache Anesthetic complications: no   No notable events documented.  Last Vitals:  Vitals:   12/27/20 1415 12/27/20 1430  BP: 111/64 110/66  Pulse: 80 85  Resp: 18 18  Temp: 36.5 C   SpO2: 99% 98%    Last Pain:  Vitals:   12/27/20 1430  TempSrc:   PainSc: 0-No pain   Pain Goal:    LLE Motor Response: Purposeful movement (12/27/20 1430)   RLE Motor Response: Purposeful movement (12/27/20 1430)       Epidural/Spinal Function Cutaneous sensation: Able to Discern Pressure (12/27/20 1430), Patient able to flex knees: Yes (12/27/20 1430), Patient able to lift hips off bed: No (12/27/20 1430), Back pain beyond tenderness at insertion site: No (12/27/20 1430), Progressively worsening motor and/or sensory loss: No (12/27/20 1430), Bowel and/or bladder incontinence post epidural: No (12/27/20 1430)  Shanda Howells

## 2020-12-27 NOTE — Progress Notes (Signed)
Patient told about call bell, And incentive spirometer.

## 2020-12-28 ENCOUNTER — Other Ambulatory Visit: Payer: 59

## 2020-12-28 LAB — CBC
HCT: 24.8 % — ABNORMAL LOW (ref 36.0–46.0)
Hemoglobin: 8.3 g/dL — ABNORMAL LOW (ref 12.0–15.0)
MCH: 28.9 pg (ref 26.0–34.0)
MCHC: 33.5 g/dL (ref 30.0–36.0)
MCV: 86.4 fL (ref 80.0–100.0)
Platelets: UNDETERMINED 10*3/uL (ref 150–400)
RBC: 2.87 MIL/uL — ABNORMAL LOW (ref 3.87–5.11)
RDW: 13.8 % (ref 11.5–15.5)
WBC: 10.2 10*3/uL (ref 4.0–10.5)
nRBC: 0 % (ref 0.0–0.2)

## 2020-12-28 MED ORDER — LEVOTHYROXINE SODIUM 50 MCG PO TABS
50.0000 ug | ORAL_TABLET | Freq: Every day | ORAL | Status: DC
  Administered 2020-12-28 – 2020-12-30 (×3): 50 ug via ORAL
  Filled 2020-12-28 (×3): qty 1

## 2020-12-28 MED ORDER — FERROUS SULFATE 325 (65 FE) MG PO TABS
325.0000 mg | ORAL_TABLET | ORAL | Status: DC
  Administered 2020-12-28 – 2020-12-30 (×2): 325 mg via ORAL
  Filled 2020-12-28 (×2): qty 1

## 2020-12-28 NOTE — Lactation Note (Addendum)
This note was copied from a baby's chart. Lactation Consultation Note  Patient Name: Boy Marja Adderley WCHEN'I Date: 12/28/2020  Mom with hx of PCOS and C/S delivery.  Age:36 hours, term female infant -4% weight loss . Per mom, infant having a lot of stool 6 in past 29 hours of life. Per mom, infant was circumcised earlier today, has breast twice since circumcision 10 minutes and then 5 minutes has been sleepy but mom is waking him to feed if longer than 3 hours.  Mom feels latching well. Has hand pump to pre-pump breast prior to latching infant, LC re-fitted with size 21 flange which mom felt was a comfortable fit. LC reviewed hand expression and mom expressed 3 mls of colostrum she will offer to infant after latching him at the breast for the next feeding. Mom doesn't have any questions or concerns for LC at this time. Mom's plan: 1- Mom will pre-pump breast with hand pump 21 flange prior to latching infant at the breast. 2- Breastfeed infant according to feeding cues, 8 to 12+ times with lots of skin to skin. 3- Mom will give infant back any EBM by spoon that she hand expressed or pumped after latching infant at the breast.. 4- Mom knows to call RN/LC for latch assistance if needed.  Maternal Data    Feeding    LATCH Score                    Lactation Tools Discussed/Used    Interventions    Discharge    Consult Status      Danelle Earthly 12/28/2020, 6:52 PM

## 2020-12-28 NOTE — Progress Notes (Addendum)
Post Operative Day 1  Subjective:  Day 1 Post-Op: Brittney Salazar is a 35 y.o. G1P1001 who presented for scheduled cesarean section due to breech presentation and delivered at [redacted]w[redacted]d She is breastfeeding and has met with lactation. She has had no pain at the site of incision or at her extremities. She has been walking around the hallways. She has been passing flatus. She stated she has mild bleeding which has improved from yesterday. She is interested in using condoms for contraception. She in interested in a circumcision for her child. She is tolerating PO. Foley catheter removed this morning, awaiting void post-removal.   Objective: Blood pressure 112/60, pulse 84, temperature 98.1 F (36.7 C), temperature source Oral, resp. rate 18, height '5\' 5"'  (1.651 m), weight 130.5 kg, SpO2 99 %, unknown if currently breastfeeding.  Physical Exam:  General: alert, cooperative, and no distress Lochia: appropriate Uterine Fundus: firm Incision: healing well, no significant drainage, no significant erythema DVT Evaluation:  No evidence of DVT seen on physical exam. No cords or calf tenderness. No significant calf/ankle edema.  Recent Labs    12/25/20 0831 12/28/20 0540  HGB 11.2* 8.3*  HCT 33.6* 24.8*   Assessment/Plan: ASONAL DORWARTis a 35y.o. G1P1001 who presented for scheduled cesarean section due to breech presentation and delivered at 334w0dPOD#1. Pt has had no post-operative complications and is interested in getting a circumcision for her child. Discussed with her the available types of contraception and pt was interested in using condoms. Will continue to monitor. VSS.   Plan:  #Acute blood loss anemia -Post -op Hgb 8.3; asymptomatic -Will start PO iron  #Feeding -Breast; going well  #Circumcision  -Desires; consented patient at bedside today   Dispo: Plan for discharge PPD#2   LOS: 1 day   StMonia PouchMS3 12/28/2020, 7:38 AM   GME ATTESTATION:  I saw and  evaluated the patient. I agree with the findings and the plan of care as documented in the student's note. I have made changes to documentation as necessary.   ChVilma MeckelMD OB Fellow, [place]/10/2020 9:08 AM

## 2020-12-29 DIAGNOSIS — Z98891 History of uterine scar from previous surgery: Secondary | ICD-10-CM

## 2020-12-29 NOTE — Lactation Note (Addendum)
This note was copied from a baby's chart. Lactation Consultation Note  Patient Name: Brittney Salazar Date: 12/29/2020 Reason for consult: Follow-up assessment;Mother's request;Difficult latch;Term;Maternal endocrine disorder Age:35 years   Infant popping on and off on left side not able to sustain latch or hear audible swallows.  Mom felt infant able to latch on right without NS. Infant still cueing after 20 min feeding. LC encouraged Mom to pump and offer EBM after latching.   With our visit, we worked on hearing audible swallows. Mom states like to work on feeding one more night at breast.    Plan 1. To feed 8-12x 24 hr period. Mom to work on latching at breast with audible swallows.  2. Mom can apply 20 NS if needed if infant popping on and off breast.  3. DEBP q 3 hrs for 36 min.  4. If infant still cueing, Mom to supplement with EBM via pace bottle feeding with slow flow nipple. BF supplementation guide provided.   All questions answered at the end of the visit.  Maternal Data    Feeding Mother's Current Feeding Choice: Breast Milk  LATCH Score Latch: Repeated attempts needed to sustain latch, nipple held in mouth throughout feeding, stimulation needed to elicit sucking reflex.  Audible Swallowing: Spontaneous and intermittent  Type of Nipple: Flat (short shafted and small. Infant on left continually popping off not able to sustain latch)  Comfort (Breast/Nipple): Soft / non-tender  Hold (Positioning): Assistance needed to correctly position infant at breast and maintain latch.  LATCH Score: 7   Lactation Tools Discussed/Used Tools: Pump;Flanges Flange Size: 21 Breast pump type: Double-Electric Breast Pump Pump Education: Setup, frequency, and cleaning;Milk Storage Reason for Pumping: increase stimulation Pumping frequency: q 3hrs for 15 min  Interventions Interventions: Breast feeding basics reviewed;Breast compression;Assisted with latch;Adjust  position;Skin to skin;Support pillows;DEBP;Breast massage;Position options;Hand express;Expressed milk;Education  Discharge    Consult Status Consult Status: Follow-up Date: 12/30/20 Follow-up type: In-patient    Brittney Frederic  Salazar 12/29/2020, 6:30 PM

## 2020-12-29 NOTE — Lactation Note (Signed)
This note was copied from a baby's chart. Lactation Consultation Note  Patient Name: Brittney Salazar Mode ORVIF'B Date: 12/29/2020 Reason for consult: Follow-up assessment;Infant weight loss Age:36 hours  P1, 8.8% weight loss.  2 voids/3 stools in the last 24 hours. Noted that baby has been having mainly 5-10 min feedings. Prior to [facility] consult baby had 20 min feeding on L breast. Attempted with LC to latch on R breast and baby was sleepy. Recommend to parents if baby continues to have short feedings mother will need to be set up with DEBP and give volume back to baby. Encouraged mother to hand express and pump w/ manual pump and give volume back to baby with feedings now. Unwrap baby and place STS to wake if needed. Offer both breasts per feeding.    Maternal Data Has patient been taught Hand Expression?: Yes Does the patient have breastfeeding experience prior to this delivery?: No  Feeding Mother's Current Feeding Choice: Breast Milk  LATCH Score Latch: Grasps breast easily, tongue down, lips flanged, rhythmical sucking.  Audible Swallowing: Spontaneous and intermittent  Type of Nipple: Everted at rest and after stimulation  Comfort (Breast/Nipple): Soft / non-tender  Hold (Positioning): Assistance needed to correctly position infant at breast and maintain latch.  LATCH Score: 9   Lactation Tools Discussed/Used    Interventions Interventions: Breast feeding basics reviewed;Education  Discharge Pump: Personal;DEBP  Consult Status Consult Status: Follow-up Date: 12/30/20 Follow-up type: In-patient    Dahlia Byes [facility] 12/29/2020, 8:57 AM

## 2020-12-29 NOTE — Progress Notes (Addendum)
Post Operative Day 2 Subjective: no complaints, up ad lib, voiding, tolerating PO, and + flatus Feeling well overall, but concerned with baby's weight loss.   Objective: Blood pressure 126/74, pulse 79, temperature 98.1 F (36.7 C), temperature source Oral, resp. rate 18, height 5\' 5"  (1.651 m), weight 130.5 kg, SpO2 100 %, unknown if currently breastfeeding.  Physical Exam:  General: alert, cooperative, and no distress Lochia: appropriate Uterine Fundus: firm Incision: healing well, no significant drainage, slight clear drainage present DVT Evaluation: No evidence of DVT seen on physical exam. No LE edema or calf tenderness to palpation.   Recent Labs    12/28/20 0540  HGB 8.3*  HCT 24.8*    Assessment/Plan: Plan for discharge tomorrow, Breastfeeding, and Contraception remains undecided It sounds as though pediatrics will be keeping baby through tomorrow due to weight loss, so we will keep her through tomorrow to troubleshoot feeding issues and pain management.    LOS: 2 days   02/27/21 12/29/2020, 7:39 AM   GME ATTESTATION:  I saw and evaluated the patient. I agree with the findings and the plan of care as documented in the resident's note. I have made changes to documentation as necessary.   Progressing well postpartum. Meeting all milestones. VSS. Will plan to discharge tomorrow to assist with feeding. Continue routine postpartum care.   02/28/2021, MD OB Fellow, Faculty [facility], Center for [facility] 12/29/2020 7:50 AM

## 2020-12-30 ENCOUNTER — Encounter (HOSPITAL_COMMUNITY): Payer: Self-pay | Admitting: Family Medicine

## 2020-12-30 MED ORDER — OXYCODONE HCL 5 MG PO TABS: 5.0000 mg | ORAL_TABLET | ORAL | 0 refills | Status: DC | PRN

## 2020-12-30 NOTE — Lactation Note (Signed)
This note was copied from a baby's chart. Lactation Consultation Note  Patient Name: Brittney Salazar BOFBP'Z Date: 12/30/2020 Reason for consult: Follow-up assessment;[place] Age:36 hours  When I entered room, infant was crying and sounded hungry. Infant had recently fed at breast. Dad was trying to console infant. Mom and I attempted to latch infant, but he was too frantic.   Mom consented for DBM. Consent was signed. Infant had been using yellow slow-flow nipple, but it was too fast. The Enfamil extra-slow flow nipple was tried. Although better, it still seemed fast with pacing. I brought the Nfant Slow Flow nipple into the room for Dad to use at the next feeding. Dr. Sheliah Hatch was made aware & will put in an SLP consult.   Providence Crosby, RN was given an update & will scan the [facility] used (it would not scan for me).    Lurline Hare [facility] 12/30/2020, 2:04 PM

## 2020-12-31 ENCOUNTER — Ambulatory Visit: Payer: Self-pay

## 2020-12-31 NOTE — Lactation Note (Signed)
This note was copied from a baby's chart. Lactation Consultation Note  Patient Name: Boy Palmira Stickle IDPOE'U Date: 12/31/2020 Reason for consult: Follow-up assessment Age:36 days Mother paged for latch assistance.  Assist mother to chair for better support. She wanted to try cross cradle.  Infant sustained latch for 15 mins with audible swallows and another 5 min on alternate breast Mother taught to pace bottle feed infant and infant was given 30 ml of EBM.  Infant tolerated well. Advised to use wide base bottle when she gets home.   Discussed treatment and prevention of engorgement. Mother agreeable to follow up with [facility] OP dept.  Message sent thur Epic for follow up phone call.  Discussed storage guidelines.  Encouraged to continue to pump every 3 hours for 15-20 mins.  Mother has hand pump and Willow wireless pump. Mother to call [facility] office for questions or concerns.   Maternal Data    Feeding Mother's Current Feeding Choice: Breast Milk and Donor Milk Nipple Type: Nfant Standard Flow (white)  LATCH Score Latch: Grasps breast easily, tongue down, lips flanged, rhythmical sucking. (mother taught tea cup hold. left nipple shorter than rt)  Audible Swallowing: Spontaneous and intermittent  Type of Nipple: Everted at rest and after stimulation  Comfort (Breast/Nipple): Filling, red/small blisters or bruises, mild/mod discomfort  Hold (Positioning): Assistance needed to correctly position infant at breast and maintain latch.  LATCH Score: 8   Lactation Tools Discussed/Used    Interventions Interventions: Breast feeding basics reviewed;Assisted with latch;Skin to skin;Hand express;Breast compression;Adjust position;Support pillows;Position options;Hand pump;DEBP;Education;Pace feeding;Ice  Discharge Discharge Education: Engorgement and breast care;Warning signs for feeding baby;Outpatient recommendation;Outpatient Epic message sent Pump: Personal [facility]  wireless)  Consult Status Consult Status: Complete    Michel Bickers 12/31/2020, 11:57 AM

## 2020-12-31 NOTE — Lactation Note (Signed)
This note was copied from a baby's chart. Lactation Consultation Note  Patient Name: Brittney Salazar Comp WLNLG'X Date: 12/31/2020   Age:36 days LC arrived in mothers room. Mother and infant both sleeping. Father reports that speech dept assist mother with breastfeeding and the bottle feeding infant.  Advised to page [facility] when mother wakes for discharge teaching and to answer questions for going home and if needs more assistance with latching infant.   Maternal Data    Feeding Nipple Type: Nfant Standard Flow (white)  LATCH Score                    Lactation Tools Discussed/Used    Interventions    Discharge    Consult Status      Michel Bickers 12/31/2020, 10:25 AM

## 2021-01-04 ENCOUNTER — Ambulatory Visit (INDEPENDENT_AMBULATORY_CARE_PROVIDER_SITE_OTHER): Payer: 59 | Admitting: *Deleted

## 2021-01-04 ENCOUNTER — Other Ambulatory Visit: Payer: Self-pay

## 2021-01-04 NOTE — Progress Notes (Signed)
Subjective:     Brittney Salazar is a 35 y.o. female who presents to the clinic 1 weeks status post low uterine, transverse cesarean section. Pt reports incision is healing well.      Objective:    BP 129/78   Pulse 81  General:  alert, well appearing, in no apparent distress  Incision:   healing well, no drainage, no erythema, no hernia, no seroma, no swelling, no dehiscence, incision well approximated     Assessment:    Doing well postoperatively.   Plan:    1. Continue any current medications. 2. Wound care discussed. 3. Follow up: as needed and or at postpartum visit.Scheryl Marten, RN

## 2021-01-04 NOTE — Progress Notes (Signed)
ATTESTATION OF SUPERVISION OF RN: Evaluation and management procedures were performed by the RN under my supervision and collaboration. I have reviewed the nursing note and chart and agree with the management and plan for this patient.  Jaun Galluzzo, CNM  

## 2021-01-09 ENCOUNTER — Encounter: Payer: Self-pay | Admitting: *Deleted

## 2021-01-11 ENCOUNTER — Telehealth (HOSPITAL_COMMUNITY): Payer: Self-pay | Admitting: *Deleted

## 2021-01-11 NOTE — Telephone Encounter (Signed)
Left message to return nurse call.  Duffy Rhody, RN 01-11-2021 at 9:11am

## 2021-01-22 ENCOUNTER — Other Ambulatory Visit: Payer: Self-pay | Admitting: Obstetrics & Gynecology

## 2021-01-22 DIAGNOSIS — O0990 Supervision of high risk pregnancy, unspecified, unspecified trimester: Secondary | ICD-10-CM

## 2021-01-26 ENCOUNTER — Encounter: Payer: Self-pay | Admitting: Family Medicine

## 2021-01-26 ENCOUNTER — Ambulatory Visit (INDEPENDENT_AMBULATORY_CARE_PROVIDER_SITE_OTHER): Payer: 59 | Admitting: Family Medicine

## 2021-01-26 ENCOUNTER — Other Ambulatory Visit: Payer: Self-pay

## 2021-01-26 NOTE — Progress Notes (Signed)
Post Partum Visit Note  Brittney Salazar is a 35 y.o. G19P1001 female who presents for a postpartum visit. She is 4 weeks postpartum following a primary cesarean section.  I have fully reviewed the prenatal and intrapartum course. The delivery was at 39 gestational weeks.  Anesthesia: spinal. Postpartum course has been unremarkable. Baby is doing well. Baby is feeding by breast. Bleeding  light . Bowel function is normal. Bladder function is normal. Patient is not sexually active. Contraception method is none- declined. Postpartum depression screening: negative. EPDS= 0   The pregnancy intention screening data noted above was reviewed. Potential methods of contraception were discussed. The patient elected to proceed with No data recorded.   Edinburgh Postnatal Depression Scale - 01/26/21 1133       Edinburgh Postnatal Depression Scale:  In the Past 7 Days   I have been able to laugh and see the funny side of things. 0    I have looked forward with enjoyment to things. 0    I have blamed myself unnecessarily when things went wrong. 0    I have been anxious or worried for no good reason. 0    I have felt scared or panicky for no good reason. 0    Things have been getting on top of me. 0    I have been so unhappy that I have had difficulty sleeping. 0    I have felt sad or miserable. 0    I have been so unhappy that I have been crying. 0    The thought of harming myself has occurred to me. 0    Edinburgh Postnatal Depression Scale Total 0             There are no preventive care reminders to display for this patient.   The following portions of the patient's history were reviewed and updated as appropriate: allergies, current medications, past family history, past medical history, past social history, past surgical history, and problem list.  Review of Systems Pertinent items noted in HPI and remainder of comprehensive ROS otherwise negative.  Objective:  BP 128/81   Pulse 75    Ht 5\' 5"  (1.651 m)   Wt 285 lb (129.3 kg)   Breastfeeding Yes   BMI 47.43 kg/m    General:  alert, cooperative, and appears stated age  Lungs: Normal effort  Heart:  regular rate and rhythm  Abdomen: soft, non-tender; bowel sounds normal; no masses,  no organomegaly   Wound well approximated incision       Assessment:   Normal postpartum exam.   Plan:   Essential components of care per ACOG recommendations:  1.  Mood and well being: Patient with negative depression screening today. Reviewed local resources for support.  - Patient tobacco use? No.   - hx of drug use? No.    2. Infant care and feeding:  -Patient currently breastmilk feeding? Yes. Discussed returning to work and pumping.  -Social determinants of health (SDOH) reviewed in EPIC. No concerns  3. Sexuality, contraception and birth spacing - Patient does not want a pregnancy in the next year.  Desired family size is 2 children.  - Reviewed forms of contraception in tiered fashion. Patient desired no method or condoms today--needed IUI and superovulation with previous pregnancy. 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   - Discussed birth spacing of 18 months  4. Sleep and fatigue -Encouraged family/partner/community support of 4 hrs of uninterrupted sleep to help with  mood and fatigue  5. Physical Recovery  - Discussed patients delivery and complications. She describes her labor as good. - Patient had a C-section. - Patient has urinary incontinence? No. - Patient is safe to resume physical and sexual activity  6.  Health Maintenance - HM due items addressed Yes - Last pap smear 06/15/2020 Diagnosis  Date Value Ref Range Status  06/15/2020   Final   - Negative for Intraepithelial Lesions or Malignancy (NILM)  06/15/2020 - Benign reactive/reparative changes  Final   Pap smear not done at today's visit.  -Breast Cancer screening indicated? No.   7. Chronic Disease/Pregnancy Condition  follow up: None   Reva Bores, MD Center for [facility], [facility]

## 2021-02-03 ENCOUNTER — Encounter: Payer: Self-pay | Admitting: *Deleted

## 2021-02-22 ENCOUNTER — Encounter: Payer: Self-pay | Admitting: Family Medicine

## 2021-02-22 ENCOUNTER — Ambulatory Visit (INDEPENDENT_AMBULATORY_CARE_PROVIDER_SITE_OTHER): Payer: Self-pay | Admitting: Family Medicine

## 2021-02-22 ENCOUNTER — Telehealth: Payer: Self-pay | Admitting: Lactation Services

## 2021-02-22 ENCOUNTER — Other Ambulatory Visit: Payer: Self-pay

## 2021-02-22 VITALS — BP 131/84 | HR 79 | Temp 97.8°F | Wt 287.0 lb

## 2021-02-22 DIAGNOSIS — N6459 Other signs and symptoms in breast: Secondary | ICD-10-CM

## 2021-02-22 DIAGNOSIS — N644 Mastodynia: Secondary | ICD-10-CM

## 2021-02-22 NOTE — Progress Notes (Signed)
Patient here for problem visit today.  C/o: Blisters on breast pt has been pumping. Pt states blister recently popped and a clear liquid came out. Pus is now coming out of the blisters from breast.Right breast skin is broken Pt states breast are sore has been applying nipple cream, warm compress and therapy pack. Pt also wearing cooling gel pack on breast.

## 2021-02-22 NOTE — Telephone Encounter (Signed)
Spoke with Dr. Alvester Morin about ordering [facility] and she approved.   Called [facility] to see if prescription called in, it had not been. Prescription was ordered.   Patient informed the medication has been ordered and location of Pharmacy.

## 2021-02-22 NOTE — Patient Instructions (Signed)
Similac Alimentum

## 2021-02-22 NOTE — Progress Notes (Signed)
Lactation problem VISIT ENCOUNTER NOTE  Subjective:   Brittney Salazar is a 35 y.o. G34P1001 female here for a a problem GYN visit.  Current complaints: bilateral nipple pain. Started after using Willow pump with 21 flange. She has since increased to 24 flange which is a better fit. She reports there was a large blister on each nipple and had significant pain. She reports the blisters opened up yesterday.     Baby is 2.5 months and taking 4-4.5oz every 2-3hrs. She wants to make sure she keeps her supply. She has a good supply and typically can get 6oz or more for a pump session. She is not latching infant due to the pain   Denies abnormal vaginal bleeding, discharge, pelvic pain, problems with intercourse or other gynecologic concerns.    Gynecologic History No LMP recorded. Contraception: none  Health Maintenance Due  Topic Date Due   COVID-19 Vaccine (5 - Booster for Pfizer series) 06/04/2020    The following portions of the patient's history were reviewed and updated as appropriate: allergies, current medications, past family history, past medical history, past social history, past surgical history and problem list.  Review of Systems Pertinent items are noted in HPI.   Objective:  BP 131/84   Pulse 79   Temp 97.8 F (36.6 C) (Oral)   Wt 287 lb (130.2 kg)   Breastfeeding Yes   BMI 47.76 kg/m  Gen: well appearing, NAD HEENT: no scleral icterus CV: RR Lung: Normal WOB Ext: warm well perfused   Attempted to take picture but Haiku was not working  Right nipple: drained blister on medial edge, base covers ~1/3 to 1/5 of nipple. No spreading erythema. No open lacerations. No wedge shaped area on breast or aerola. The roof of blister is still present.   - I asked for permission to hand express to help assess milk duct patency. Breast was quite full and I was able to confirm all distal ducts are patent. Patient tolerated hand expression-- no nipple stimulation was applied and  simple compression and then extension of pressure to aerolar edge yielded milk easily.   Left Nipple: drained blister on lateral edge of nipple, base covers 1/3 of nipple. No spreading erythema. No open lacerations. No wedge shaped area on breast or aerola. The roof of blister is still present.   - I asked for permission to hand express to help assess milk duct patency. Breast was quite full and I was able to confirm all distal ducts are patent. Patient tolerated hand expression-- no nipple stimulation was applied and simple compression and then extension of pressure to aerolar edge yielded milk easily.     Assessment and Plan:    #Pump trauma with Nipple damage - This appears to be classic pump trauma to bilateral nipples R>L but present and severe on both.   Goals of care  1) heal lesions  2) maintain milk supply  - RN sent in [facility]  - Recommended hand expression with no nipple stimulation every 2 hours  - safe to collect milk and provide to infant in bottle. Recommended paced bottle feeding - Would avoid latching and pumping for 2-3 days to allow area to heal - Discussed that roof of blister will slough off and expose the underlying tissue which will get a skin layer  - Once improved-- pump but apply coconut to flange to help lower friction, do not pump > 15 minutes.  - Recommend bringing flanges to [facility] visit to assure 24  is correct -- I suspect the patient might benefit from a 27 flange - LC visit to help with latching - Communicated directly with Noralee Stain RN IBCLC via Epic Chart about patient to assure follow up next week.   Face to face time:  30 minutes  Greater than 50% of the visit time was spent in counseling and coordination of care with the patient.  The summary and outline of the counseling and care coordination is summarized in the note above.   All questions were answered.  Please refer to After Visit Summary for other counseling recommendations.   Return in about  1 week (around 03/01/2021) for lactation at [facility].  No future appointments.   Federico Flake, MD, MPH, ABFM Attending Physician Faculty Practice- Center for [facility]

## 2021-02-24 ENCOUNTER — Encounter: Payer: Self-pay | Admitting: Family Medicine

## 2021-03-15 ENCOUNTER — Encounter: Payer: Self-pay | Admitting: Lactation Services

## 2021-03-15 ENCOUNTER — Other Ambulatory Visit: Payer: Self-pay | Admitting: Lactation Services

## 2021-03-15 ENCOUNTER — Encounter: Payer: Self-pay | Admitting: Family Medicine

## 2021-03-15 ENCOUNTER — Ambulatory Visit: Payer: 59 | Admitting: Family Medicine

## 2021-03-15 MED ORDER — METOCLOPRAMIDE HCL 10 MG PO TABS: 10.0000 mg | ORAL_TABLET | Freq: Three times a day (TID) | ORAL | 0 refills | Status: DC

## 2021-03-15 MED ORDER — METFORMIN HCL 500 MG PO TABS: 500.0000 mg | ORAL_TABLET | Freq: Two times a day (BID) | ORAL | 6 refills | Status: DC

## 2021-03-21 ENCOUNTER — Encounter: Payer: Self-pay | Admitting: *Deleted

## 2021-04-20 ENCOUNTER — Encounter: Payer: Self-pay | Admitting: Family Medicine

## 2021-04-20 DIAGNOSIS — R4586 Emotional lability: Secondary | ICD-10-CM

## 2021-04-21 ENCOUNTER — Ambulatory Visit (INDEPENDENT_AMBULATORY_CARE_PROVIDER_SITE_OTHER): Payer: 59 | Admitting: Licensed Clinical Social Worker

## 2021-04-21 ENCOUNTER — Telehealth: Payer: Self-pay | Admitting: Lactation Services

## 2021-04-21 ENCOUNTER — Other Ambulatory Visit: Payer: Self-pay | Admitting: Lactation Services

## 2021-04-21 DIAGNOSIS — F53 Postpartum depression: Secondary | ICD-10-CM

## 2021-04-21 MED ORDER — METOCLOPRAMIDE HCL 10 MG PO TABS: 10.0000 mg | ORAL_TABLET | Freq: Three times a day (TID) | ORAL | 0 refills | Status: DC

## 2021-04-21 NOTE — Telephone Encounter (Signed)
Received message from [facility] that patient has concerns about breast feeding and that she is wanting a refill on Reglan.   Called patient to discuss Lactation concerns. She did not answer. LM for her to call the office at [phone] her convenience. Informed I will be sending My Chart message also.

## 2021-04-21 NOTE — Telephone Encounter (Signed)
Called patient to let her know Reglan has been re-prescribed.   Patient has called Labor board and left message for them to call her back.   Patient voiced understanding.

## 2021-04-21 NOTE — Telephone Encounter (Signed)
Called patient to discuss Lactation concerns.   Patient reports infant is no longer breast feeding. Patient asked about another prescription for Reglan. She reports it did help with breast milk production.   Patient reports she is only able to pump 3-4 times a day. She reports she does not have a place to pump at work. She pumps on the floor and goes to the bathroom to take out pump. 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 She works for [employer]. 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 This is making it difficult for patient to pump at work. Mom is aware it is recommended she pump about 8 times a day to maintain milk supply.   Will reach out to Dr. Alvester Morin to discuss if refill on Reglan would be appropriate.

## 2021-04-21 NOTE — Progress Notes (Signed)
Reordered Reglan for breast milk production per Dr. Alvester Morin.

## 2021-04-25 NOTE — BH Specialist Note (Signed)
Integrated Behavioral Health via Telemedicine Visit  04/25/2021 Brittney Salazar [id number]  Number of Integrated Behavioral Health visits: 1 Session Start time: 8:15am  Session End time: 8:51am Total time: 36 mins via mychart video   Referring Provider: Priscille Kluver  Patient/Family location: Home  [facility] Provider location: [facility]  All persons participating in visit: Pt A Brittney Salazar and LCSW A. Talar Fraley  Types of Service: Individual psychotherapy and Video visit  I connected with Brittney Salazar and/or Brittney Salazar's n/a via  Telephone or Video Enabled Telemedicine Application  (Video is Caregility application) and verified that I am speaking with the correct person using two identifiers. Discussed confidentiality: Yes   I discussed the limitations of telemedicine and the availability of in person appointments.  Discussed there is a possibility of technology failure and discussed alternative modes of communication if that failure occurs.  I discussed that engaging in this telemedicine visit, they consent to the provision of behavioral healthcare and the services will be billed under their insurance.  Patient and/or legal guardian expressed understanding and consented to Telemedicine visit: Yes   Presenting Concerns: Patient and/or family reports the following symptoms/concerns: postpartum depression  Duration of problem: feeling on edge, difficulty sleeping, worry, feeling at fault when things go wrong, conflicting parenting patterns with partner; Severity of problem: mild  Patient and/or Family's Strengths/Protective Factors: Concrete supports in place (healthy food, safe environments, etc.)  Goals Addressed: Patient will:  Reduce symptoms of: mood instability and stress   Increase knowledge and/or ability of: coping skills and stress reduction   Demonstrate ability to: Increase healthy adjustment to current life circumstances  Progress towards  Goals: Ongoing  Interventions: Interventions utilized:  Supportive Counseling Standardized Assessments completed: n/a  Patient and/or Family Response:   Assessment: Patient currently experiencing postpartum depression .   Patient may benefit from integrated behavioral health.  Plan: Follow up with behavioral health clinician on : 2weeks via mychart  Behavioral recommendations: Intentionally engage and schedule self care and interest to prevent burnout, begin journal witting to identify triggers, prioritize rest Referral(s): Integrated [employer] (In Clinic)  I discussed the assessment and treatment plan with the patient and/or parent/guardian. They were provided an opportunity to ask questions and all were answered. They agreed with the plan and demonstrated an understanding of the instructions.   They were advised to call back or seek an in-person evaluation if the symptoms worsen or if the condition fails to improve as anticipated.  Brittney Saxon, LCSW

## 2021-05-07 ENCOUNTER — Ambulatory Visit: Payer: 59

## 2021-05-15 ENCOUNTER — Ambulatory Visit: Payer: 59 | Admitting: Family Medicine

## 2021-05-24 ENCOUNTER — Other Ambulatory Visit: Payer: Self-pay

## 2021-05-24 ENCOUNTER — Ambulatory Visit
Admission: EM | Admit: 2021-05-24 | Discharge: 2021-05-24 | Disposition: A | Discharge status: Home / Self Care | Payer: 59

## 2021-05-24 DIAGNOSIS — N644 Mastodynia: Secondary | ICD-10-CM | POA: Diagnosis not present

## 2021-05-24 NOTE — ED Triage Notes (Signed)
Pt c/o pain in left midaxilary and left areola areas s/p pumping. States breast were hurting this morning so she began pumping, was interrupted to get ready for work, and cont to pump once at work. After pumping at work a sharp pain has started. Onset today. States has had this pain before but it stops after pumping whereas today it is not subsiding.

## 2021-05-24 NOTE — ED Provider Notes (Signed)
[name]    CSN: [id number] Arrival date & time: 05/24/21  1435      History   Chief Complaint Chief Complaint  Patient presents with   left breast pain    HPI Brittney Salazar is a 36 y.o. female.   Patient presents with left lateral breast pain that started today.  Patient reports that she started having breast pain, so she thought that she needed to pump as she is currently using a breast pump to feed her infant.  She decided to pump and pumped for 1 hour.  Pain did not subside after pumping.  Denies any associated nipple discharge, erythema, swelling to the breast.  Patient denies that this has happened before.    Past Medical History:  Diagnosis Date   Hx of metrorrhagia    Hypothyroidism    Left tubal pregnancy with intrauterine pregnancy 05/18/2020   S/p left salpingectomy    Patient Active Problem List   Diagnosis Date Noted   History of cesarean delivery 12/29/2020   History of hypothyroidism 10/13/2020   Alpha thalassemia silent carrier 07/01/2020   Maternal morbid obesity, antepartum ([place]) 06/15/2020   BMI 40.0-44.9, adult ([place]) 08/09/2019   Infertility associated with anovulation 08/07/2011   Polycystic ovarian syndrome 08/07/2011   Elevated testosterone level in female 08/07/2011   Vitamin D deficiency 08/07/2011   Hirsutism 08/07/2011    Past Surgical History:  Procedure Laterality Date   CESAREAN SECTION N/A 12/27/2020   Procedure: CESAREAN SECTION;  Surgeon: Truett Mainland, DO;  Location: [facility] LD ORS;  Service: Obstetrics;  Laterality: N/A;   SALPINGECTOMY Left    WISDOM TOOTH EXTRACTION      OB History     Gravida  1   Para  1   Term  1   Preterm  0   AB  0   Living  1      SAB  0   IAB  0   Ectopic  0   Multiple  0   Live Births  1            Home Medications    Prior to Admission medications   Medication Sig Start Date End Date Taking? Authorizing Provider  albuterol (VENTOLIN HFA) 108 (90 Base)  MCG/ACT inhaler Inhale 1-2 puffs into the lungs every 6 (six) hours as needed for wheezing or shortness of breath.    [provider]  docusate sodium (COLACE) 100 MG capsule Take 1 capsule (100 mg total) by mouth daily as needed for mild constipation. 01/23/21   Anyanwu, Sallyanne Havers, MD  ferrous sulfate (FERROUSUL) 325 (65 FE) MG tablet Take 1 tablet (325 mg total) by mouth every other day. Patient not taking: Reported on 02/22/2021 10/17/20   Aletha Halim, MD  FLOVENT HFA 44 MCG/ACT inhaler Inhale 2 puffs into the lungs 2 (two) times daily. 11/07/20   [provider]  levothyroxine (SYNTHROID) 50 MCG tablet Take 50 mcg by mouth daily before breakfast. Take 100 mcg by mouth on Tuesday and Saturday, take 50 mcg on Monday, Wednesday, Thursday, Friday and Sunday    [provider]  metFORMIN (GLUCOPHAGE) 500 MG tablet Take 1 tablet (500 mg total) by mouth 2 (two) times daily with a meal. 03/15/21   Caren Macadam, MD  metoCLOPramide (REGLAN) 10 MG tablet Take 1 tablet (10 mg total) by mouth 3 (three) times daily before meals. Take 3 times a day for 30 days,Then take 2 a day for 4  days, Then take 1 a day for 4 days, Then stop 03/15/21   Caren Macadam, MD  metoCLOPramide (REGLAN) 10 MG tablet Take 1 tablet (10 mg total) by mouth 3 (three) times daily before meals. Take 3 times a day for 30 days,Then take 2 a day for 4 days, Then take 1 a day for 4 days, Then stop 04/21/21   Caren Macadam, MD  Prenatal MV & Min w/FA-DHA (PRENATAL GUMMIES PO) Take 2 tablets by mouth daily.    [provider]  vitamin k 100 MCG tablet Take 100 mcg by mouth daily.    [provider]    Family History Family History  Problem Relation Age of Onset   Hypertension Mother    Arthritis Father     Social History Social History   Tobacco Use   Smoking status: Former    Packs/day: 0.25    Types: Cigarettes   Smokeless tobacco: Never  Vaping Use   Vaping  Use: Never used  Substance Use Topics   Alcohol use: No   Drug use: No     Allergies   Ciprofloxacin   Review of Systems Review of Systems Per HPI  Physical Exam Triage Vital Signs ED Triage Vitals [05/24/21 1443]  Enc Vitals Group     BP 112/69     Pulse Rate 90     Resp 18     Temp 98.1 F (36.7 C)     Temp Source Oral     SpO2 97 %     Weight      Height      Head Circumference      Peak Flow      Pain Score 0     Pain Loc      Pain Edu?      Excl. in [place]?    No data found.  Updated Vital Signs BP 112/69 (BP Location: Left Arm)    Pulse 90    Temp 98.1 F (36.7 C) (Oral)    Resp 18    SpO2 97%    Breastfeeding Yes   Visual Acuity Right Eye Distance:   Left Eye Distance:   Bilateral Distance:    Right Eye Near:   Left Eye Near:    Bilateral Near:     Physical Exam Exam conducted with a chaperone present.  Constitutional:      General: She is not in acute distress.    Appearance: Normal appearance. She is not toxic-appearing or diaphoretic.  HENT:     Head: Normocephalic and atraumatic.  Eyes:     Extraocular Movements: Extraocular movements intact.     Conjunctiva/sclera: Conjunctivae normal.  Pulmonary:     Effort: Pulmonary effort is normal.  Chest:     Comments: Tenderness to palpation to left lateral breast. No obvious erythema, swelling, masses present.  No nipple discharge present. Neurological:     General: No focal deficit present.     Mental Status: She is alert and oriented to person, place, and time. Mental status is at baseline.  Psychiatric:        Mood and Affect: Mood normal.        Behavior: Behavior normal.        Thought Content: Thought content normal.        Judgment: Judgment normal.     UC Treatments / Results  Labs (all labs ordered are listed, but only abnormal results are displayed) Labs Reviewed - No data to display  EKG   Radiology No results found.  Procedures Procedures (including critical care  time)  Medications Ordered in UC Medications - No data to display  Initial Impression / Assessment and Plan / UC Course  I have reviewed the triage vital signs and the nursing notes.  Pertinent labs & imaging results that were available during my care of the patient were reviewed by me and considered in my medical decision making (see chart for details).     Differential diagnoses include mastitis, plugged duct, breast abscess.  Low suspicion for infection given physical exam.  Spoke with Hilda Blades, MD who is patient's OB/GYN who recommended ultrasound of left breast to rule out abscess.  Ultrasound ordered at imaging center.  Paperwork was faxed over.  Discussed supportive care for possible plugged duct.  Patient to follow-up with OB/GYN and lactation specialist for further evaluation and management as well.  Discussed return precautions.  Patient verbalized understanding and was agreeable with plan. Final Clinical Impressions(s) / UC Diagnoses   Final diagnoses:  Breast pain, left     Discharge Instructions      An ultrasound has been ordered for you at the breast center of [facility] imaging.  Your information has been faxed over to them.  Somebody from the breast center will reach out to you to schedule this appointment.  Please follow-up with OB/GYN and lactation specialist for further evaluation and management as well.     ED Prescriptions   None    PDMP not reviewed this encounter.   Teodora Medici,  05/24/21 1530

## 2021-05-24 NOTE — Discharge Instructions (Signed)
An ultrasound has been ordered for you at the breast center of [facility] imaging.  Your information has been faxed over to them.  Somebody from the breast center will reach out to you to schedule this appointment.  Please follow-up with OB/GYN and lactation specialist for further evaluation and management as well.

## 2021-05-26 ENCOUNTER — Emergency Department (HOSPITAL_COMMUNITY)
Admission: EM | Admit: 2021-05-26 | Discharge: 2021-05-26 | Disposition: A | Discharge status: Home / Self Care | Payer: 59 | Attending: Emergency Medicine | Admitting: Emergency Medicine

## 2021-05-26 ENCOUNTER — Encounter (HOSPITAL_COMMUNITY): Payer: Self-pay | Admitting: Emergency Medicine

## 2021-05-26 ENCOUNTER — Other Ambulatory Visit: Payer: Self-pay | Admitting: Internal Medicine

## 2021-05-26 ENCOUNTER — Other Ambulatory Visit: Payer: Self-pay

## 2021-05-26 DIAGNOSIS — N644 Mastodynia: Secondary | ICD-10-CM | POA: Diagnosis present

## 2021-05-26 NOTE — ED Triage Notes (Signed)
Patient presents due to left breast pain which began Wednesday. The pain originally start from the axillary area and spread to the nipple. Since yesterday the pain is mainly focused in one spot between those 2 points. She also noticed a decrease in milk production yesterday.

## 2021-05-26 NOTE — Discharge Instructions (Addendum)
You were seen here today for evaluation of your left breast pain for the past 3 days.  I called [facility] and they have received the referral from urgent care earlier this week.  Please call them to schedule an appointment.  I advise you to consult your OB/GYN to schedule appointment at the soonest availability.  Continue to place ice or hot compresses over the area. Because you are still breastfeeding, you can take Tylenol as needed for pain. Additionally, I have placed a referral to the breast clinic. If you feel any hard areas, have any nipple discharge, dimpling, or discoloration, please let your PCP know and return to the ER.

## 2021-05-26 NOTE — ED Provider Notes (Signed)
[facility]-EMERGENCY DEPT Provider Note   CSN: [id number] Arrival date & time: 05/26/21  1137     History Chief Complaint  Patient presents with   Breast Pain    Brittney Salazar is a 36 y.o. female female presents the emergency department for evaluation of left lateral breast pain that started over the past 3 days.  She reports decrease in milk production during this time in her bilateral breasts.  She reports that she is barely able to get 3 ounces out of each.  She denies any nipple discharge, erythema, or swelling to the breast.  She denies any dimpling or irregularity.  She was seen at urgent care on 05-24-2021 for this pain.  She was referred to [facility] imaging for outpatient ultrasound.  She reports that no once called her nor did the [facility] imaging stated they had a referral for her.  She reports that she contact her OB/GYN but did not hear anything back.  HPI     Home Medications Prior to Admission medications   Medication Sig Start Date End Date Taking? Authorizing Provider  albuterol (VENTOLIN HFA) 108 (90 Base) MCG/ACT inhaler Inhale 1-2 puffs into the lungs every 6 (six) hours as needed for wheezing or shortness of breath.    [provider]  docusate sodium (COLACE) 100 MG capsule Take 1 capsule (100 mg total) by mouth daily as needed for mild constipation. 01/23/21   Anyanwu, Jethro Bastos, MD  ferrous sulfate (FERROUSUL) 325 (65 FE) MG tablet Take 1 tablet (325 mg total) by mouth every other day. Patient not taking: Reported on 02/22/2021 10/17/20   Rankin Bing, MD  FLOVENT HFA 44 MCG/ACT inhaler Inhale 2 puffs into the lungs 2 (two) times daily. 11/07/20   [provider]  levothyroxine (SYNTHROID) 50 MCG tablet Take 50 mcg by mouth daily before breakfast. Take 100 mcg by mouth on Tuesday and Saturday, take 50 mcg on Monday, Wednesday, Thursday, Friday and Sunday    [provider]  metFORMIN (GLUCOPHAGE) 500 MG tablet Take  1 tablet (500 mg total) by mouth 2 (two) times daily with a meal. 03/15/21   Federico Flake, MD  metoCLOPramide (REGLAN) 10 MG tablet Take 1 tablet (10 mg total) by mouth 3 (three) times daily before meals. Take 3 times a day for 30 days,Then take 2 a day for 4 days, Then take 1 a day for 4 days, Then stop 03/15/21   Federico Flake, MD  metoCLOPramide (REGLAN) 10 MG tablet Take 1 tablet (10 mg total) by mouth 3 (three) times daily before meals. Take 3 times a day for 30 days,Then take 2 a day for 4 days, Then take 1 a day for 4 days, Then stop 04/21/21   Federico Flake, MD  Prenatal MV & Min w/FA-DHA (PRENATAL GUMMIES PO) Take 2 tablets by mouth daily.    [provider]  vitamin k 100 MCG tablet Take 100 mcg by mouth daily.    [provider]      Allergies    Ciprofloxacin    Review of Systems   Review of Systems  Genitourinary:        Reports breat pain  See HPI  Physical Exam Updated Vital Signs BP 138/78 (BP Location: Left Arm)    Pulse 79    Temp 98 F (36.7 C) (Oral)    Resp 16    Ht 5\' 4"  (1.626 m)    Wt 131.5 kg  SpO2 100%    BMI 49.78 kg/m  Physical Exam Vitals and nursing note reviewed. Exam conducted with a chaperone present Lamar Laundry, RN).  Constitutional:      General: She is not in acute distress.    Appearance: Normal appearance. She is not toxic-appearing.  Eyes:     General: No scleral icterus. Cardiovascular:     Rate and Rhythm: Normal rate and regular rhythm.  Pulmonary:     Effort: Pulmonary effort is normal. No respiratory distress.     Breath sounds: Normal breath sounds.  Chest:     Chest wall: No mass.  Breasts:    Right: No swelling, bleeding, inverted nipple, mass, nipple discharge, skin change or tenderness.     Left: Tenderness present. No swelling, bleeding, inverted nipple, mass, nipple discharge or skin change.       Comments: Breast exam done in the supine position. On the left breast, in the lower  medial quadrant, fibrocystic changes palpated. The patient had tenderness to palpation on the left lateral aspect of her breast. No palpable masses or nodules. No induration of fluctuance noted. No overlying skin changes noted. No dimpling of breast discharge noted.  Lymphadenopathy:     Upper Body:     Right upper body: No supraclavicular or axillary adenopathy.     Left upper body: No supraclavicular or axillary adenopathy.  Skin:    General: Skin is dry.     Findings: No rash.  Neurological:     General: No focal deficit present.     Mental Status: She is alert. Mental status is at baseline.  Psychiatric:        Mood and Affect: Mood normal.    ED Results / Procedures / Treatments   Labs (all labs ordered are listed, but only abnormal results are displayed) Labs Reviewed - No data to display  EKG None  Radiology No results found.  Procedures Procedures    Medications Ordered in ED Medications - No data to display  ED Course/ Medical Decision Making/ A&P                           Medical Decision Making  36 y/o F presents to the ED for evaluation of left breast pain for the past 2-3 days. Differential diagnosis includes cyst, abscess, mastitis, malignancy, premenstrual syndrome, clogged duct. Vital signs are stable. The patient is afebrile, normotensive, and has a normal pulse rate. Physical exam shows some lateral breast tenderness to palpation, however there is not any fluctuance, induration, mass, nodule, skin color changes, nipple discharge, or dimpling seen in bilateral breasts.  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  At this time, position for any mastitis is is not an increased warmth, overlying skin changes, the breast is not empiric engorged.  The fact that her her milk supply is decreased in bilateral breasts is also reassuring.  Although this is probably just musculoskeletal, will consult OB/GYN for further  recommendation.  Spoke with Dr. Donavan Foil who recommends breast ultrasound follow up. I called and spoke with a representative at [facility] who say that they now have a referral for her, but can not schedule her for an appointment until she has been discharged in the emergency department.  I discussed with the patient that this may be MSK due to her job and recently starting back.  I have a low suspicion that this is any  clogged ducts or mastitis given that she has a decrease milk supply in her bilateral breast.  Recommended cold and heat applied to the affected area daily as needed.  Recommended she take Tylenol as needed for pain as there is not good data to support NSAIDs through breastmilk.  Recommended gentle stretching in the area.  We discussed for her to [facility] to schedule a breast ultrasound when she leaves the emergency department.  I recommended that she also call the breast clinic in [place] that have attached to her discharge paperwork to see if they can see her in sooner.  I also advised her to keep in touch with her OB/GYN Dr. Alvester MorinNewton to see if she has a response on her breast pain.  Strict return precautions discussed.  Patient agrees with plan.  Patient is stable being discharged home in good condition.  Final Clinical Impression(s) / ED Diagnoses Final diagnoses:  Breast pain    Rx / DC Orders ED Discharge Orders     None         Achille RichRansom, Aniello Christopoulos, PA-C 05/27/21 [id number]    Terrilee FilesButler, Michael C, MD 05/28/21 [phone]

## 2021-06-06 ENCOUNTER — Other Ambulatory Visit: Payer: Self-pay

## 2021-06-06 ENCOUNTER — Ambulatory Visit (INDEPENDENT_AMBULATORY_CARE_PROVIDER_SITE_OTHER): Payer: 59 | Admitting: Family Medicine

## 2021-06-06 ENCOUNTER — Encounter: Payer: Self-pay | Admitting: Family Medicine

## 2021-06-06 VITALS — BP 132/86 | HR 69

## 2021-06-06 DIAGNOSIS — O9270 Unspecified disorders of lactation: Secondary | ICD-10-CM | POA: Diagnosis not present

## 2021-06-06 DIAGNOSIS — F53 Postpartum depression: Secondary | ICD-10-CM

## 2021-06-06 NOTE — Progress Notes (Signed)
° °  GYNECOLOGY PROBLEM  VISIT ENCOUNTER NOTE  Subjective:   Brittney Salazar is a 36 y.o. G11P1001 female here for a problem GYN visit.  Current complaints: Depression/mood, decreased milk supply.   Denies abnormal vaginal bleeding, discharge, pelvic pain, problems with intercourse or other gynecologic concerns.    Gynecologic History No LMP recorded.  Contraception: none  Health Maintenance Due  Topic Date Due   COVID-19 Vaccine (5 - Booster for Pfizer series) 06/04/2020    The following portions of the patient's history were reviewed and updated as appropriate: allergies, current medications, past family history, past medical history, past social history, past surgical history and problem list.  Review of Systems Pertinent items are noted in HPI.   Objective:  BP 132/86    Pulse 69  Gen: well appearing, NAD HEENT: no scleral icterus CV: RR Lung: Normal WOB Ext: warm well perfused  PELVIC: Normal appearing external genitalia; normal appearing vaginal mucosa and cervix.  No abnormal discharge noted.   Normal uterine size, no other palpable masses, no uterine or adnexal tenderness.   Assessment and Plan:  1. Lactation disorder Normalized decrease in supply after mastitis/clogged duct Applauded her efforts and longevity in pumping Reviewed that supply sometimes rebounds  Infant is tolerating formula Discussed given whatever milk is pumped Patient has Reglan if she would like to use this she can to help increase supply.   2. Postpartum depression Stable. In counseling currently and focusing on wellness and personal health engagement Reviewed lactation safe medication Discussed taking time for herself and praised for seeking car.e    Please refer to After Visit Summary for other counseling recommendations.   Return in about 4 weeks (around 07/04/2021) for fu depression .  Federico Flake, MD, MPH, ABFM Attending Physician Faculty Practice- Center for [facility]

## 2021-06-13 ENCOUNTER — Other Ambulatory Visit: Payer: 59

## 2021-06-20 ENCOUNTER — Ambulatory Visit: Payer: 59

## 2021-06-20 ENCOUNTER — Ambulatory Visit
Admission: RE | Admit: 2021-06-20 | Discharge: 2021-06-20 | Disposition: A | Discharge status: Home / Self Care | Payer: 59 | Source: Ambulatory Visit | Attending: Internal Medicine | Admitting: Internal Medicine

## 2021-06-20 DIAGNOSIS — N644 Mastodynia: Secondary | ICD-10-CM

## 2021-06-21 ENCOUNTER — Encounter: Payer: Self-pay | Admitting: Family Medicine

## 2021-06-26 NOTE — Progress Notes (Signed)
Results were reviewed with MD on site at breast imaging center. Patient was also seen in ED for treatment and evaluation for same symptoms following UC visit. No further treatment at this time.

## 2021-08-07 ENCOUNTER — Ambulatory Visit (INDEPENDENT_AMBULATORY_CARE_PROVIDER_SITE_OTHER): Payer: 59 | Admitting: Family Medicine

## 2021-08-07 ENCOUNTER — Encounter: Payer: Self-pay | Admitting: Family Medicine

## 2021-08-07 VITALS — BP 128/82 | HR 90 | Wt 299.0 lb

## 2021-08-07 DIAGNOSIS — R4586 Emotional lability: Secondary | ICD-10-CM | POA: Diagnosis not present

## 2021-08-07 NOTE — Progress Notes (Signed)
Pt here to F/U on Depression.  ? ?Pt states she had therapy session this morning went well.   ? ?PHQ9=4 ?GAD 7 = 4  ? ? ?

## 2021-08-07 NOTE — Progress Notes (Signed)
? ?  PROBLEM  VISIT ENCOUNTER NOTE- Mood changes ? ?Visit was performed IN-person ? ?Subjective:  ? Brittney Salazar is a 36 y.o. G33P1001 female here for a mood check  ? ?She has started therapy and it is going well. Reports depression sx are improved.   PHQ9 and GAD are low risk ? ?Infant feeding: ?Patient is not providing breastmilk.  ? ?Gynecologic History ?No LMP recorded. ? ?Contraception:  PCOS-- pregnancy was conceived via IVF ? ?Health Maintenance Due  ?Topic Date Due  ? COVID-19 Vaccine (5 - Booster for Pfizer series) 06/04/2020  ? ? ?The following portions of the patient's history were reviewed and updated as appropriate: allergies, current medications, past family history, past medical history, past social history, past surgical history and problem list. ? ?Review of Systems ?Pertinent items are noted in HPI. ?  ?Objective:  ?BP 128/82   Pulse 90   Wt 299 lb (135.6 kg)   BMI 51.32 kg/m?  ?Gen: well appearing, NAD ?HEENT: no scleral icterus ?CV: RR ?Lung: Normal WOB ?Ext: warm well perfused ? ?Assessment and Plan:  ?1. Mood changes ?Stable, doing well  ?Continue therapy ?Low threshold to reach out to clinic ? ?Please refer to After Visit Summary for other counseling recommendations.  ? ?No follow-ups on file. ? ?Federico Flake, MD, MPH, ABFM ?Attending Physician ?Faculty Practice- [facility] for [facility] ? ?

## 2021-08-16 ENCOUNTER — Encounter: Payer: Self-pay | Admitting: Family Medicine

## 2021-08-16 DIAGNOSIS — F419 Anxiety disorder, unspecified: Secondary | ICD-10-CM

## 2021-09-01 MED ORDER — BUPROPION HCL ER (XL) 150 MG PO TB24: 150.0000 mg | ORAL_TABLET | Freq: Every day | ORAL | 3 refills | Status: DC

## 2021-09-01 MED ORDER — HYDROXYZINE HCL 25 MG PO TABS: 25.0000 mg | ORAL_TABLET | Freq: Four times a day (QID) | ORAL | 2 refills | Status: DC | PRN

## 2021-09-05 ENCOUNTER — Encounter (INDEPENDENT_AMBULATORY_CARE_PROVIDER_SITE_OTHER): Payer: Self-pay | Admitting: *Deleted

## 2021-09-07 ENCOUNTER — Encounter: Payer: Self-pay | Admitting: Family Medicine

## 2021-11-09 ENCOUNTER — Ambulatory Visit
Admission: EM | Admit: 2021-11-09 | Discharge: 2021-11-09 | Disposition: A | Discharge status: Home / Self Care | Payer: 59 | Attending: Internal Medicine | Admitting: Internal Medicine

## 2021-11-09 ENCOUNTER — Encounter: Payer: Self-pay | Admitting: Emergency Medicine

## 2021-11-09 ENCOUNTER — Other Ambulatory Visit: Payer: Self-pay

## 2021-11-09 DIAGNOSIS — J029 Acute pharyngitis, unspecified: Secondary | ICD-10-CM | POA: Diagnosis present

## 2021-11-09 DIAGNOSIS — J069 Acute upper respiratory infection, unspecified: Secondary | ICD-10-CM | POA: Diagnosis present

## 2021-11-09 LAB — POCT RAPID STREP A (OFFICE): Rapid Strep A Screen: NEGATIVE

## 2021-11-09 MED ORDER — GUAIFENESIN 200 MG PO TABS: 200.0000 mg | ORAL_TABLET | ORAL | 0 refills | Status: DC | PRN

## 2021-11-09 MED ORDER — FLUTICASONE PROPIONATE 50 MCG/ACT NA SUSP: 1.0000 | Freq: Every day | NASAL | 0 refills | Status: DC

## 2021-11-09 NOTE — ED Provider Notes (Signed)
[name]    CSN: [id number] Arrival date & time: 11/09/21  0804      History   Chief Complaint Chief Complaint  Patient presents with   Sore Throat    HPI Brittney Salazar is a 36 y.o. female.   Patient presents with sore throat and nasal congestion that started upon awakening this morning.  She reports cough that only occurs due to nasal drainage that she feels in the back of her throat.  Denies any known fevers or sick contacts.  Denies chest pain, shortness of breath, ear pain, nausea, vomiting, diarrhea, abdominal pain.  Patient has taken a nighttime cold medication with minimal improvement in symptoms.   Sore Throat    Past Medical History:  Diagnosis Date   Hx of metrorrhagia    Hypothyroidism    Left tubal pregnancy with intrauterine pregnancy 05/18/2020   S/p left salpingectomy    Patient Active Problem List   Diagnosis Date Noted   History of cesarean delivery 12/29/2020   History of hypothyroidism 10/13/2020   Alpha thalassemia silent carrier 07/01/2020   BMI 40.0-44.9, adult (HCC) 08/09/2019   Infertility associated with anovulation 08/07/2011   Polycystic ovarian syndrome 08/07/2011   Elevated testosterone level in female 08/07/2011   Vitamin D deficiency 08/07/2011   Hirsutism 08/07/2011    Past Surgical History:  Procedure Laterality Date   CESAREAN SECTION N/A 12/27/2020   Procedure: CESAREAN SECTION;  Surgeon: Levie Heritage, DO;  Location: [facility];  Service: Obstetrics;  Laterality: N/A;   SALPINGECTOMY Left    WISDOM TOOTH EXTRACTION      OB History     Gravida  1   Para  1   Term  1   Preterm  0   AB  0   Living  1      SAB  0   IAB  0   Ectopic  0   Multiple  0   Live Births  1            Home Medications    Prior to Admission medications   Medication Sig Start Date End Date Taking? Authorizing Provider  fluticasone (FLONASE) 50 MCG/ACT nasal spray Place 1 spray into both nostrils daily.  11/09/21  Yes , Rolly Salter E, FNP  guaiFENesin 200 MG tablet Take 1 tablet (200 mg total) by mouth every 4 (four) hours as needed for cough or to loosen phlegm. 11/09/21  Yes , Acie Fredrickson, FNP  albuterol (VENTOLIN HFA) 108 (90 Base) MCG/ACT inhaler Inhale 1-2 puffs into the lungs every 6 (six) hours as needed for wheezing or shortness of breath.    [provider]  buPROPion (WELLBUTRIN XL) 150 MG 24 hr tablet Take 1 tablet (150 mg total) by mouth daily. 09/01/21   Federico Flake, MD  docusate sodium (COLACE) 100 MG capsule Take 1 capsule (100 mg total) by mouth daily as needed for mild constipation. Patient not taking: Reported on 08/07/2021 01/23/21   Anyanwu, Jethro Bastos, MD  FLOVENT HFA 44 MCG/ACT inhaler Inhale 2 puffs into the lungs 2 (two) times daily. 11/07/20   [provider]  hydrOXYzine (ATARAX) 25 MG tablet Take 1 tablet (25 mg total) by mouth every 6 (six) hours as needed for anxiety. 09/01/21   Federico Flake, MD  levothyroxine (SYNTHROID) 50 MCG tablet Take 50 mcg by mouth daily before breakfast. Take 100 mcg by mouth on Tuesday and Saturday, take 50 mcg on Monday, Wednesday, Thursday, Friday  and Sunday    [provider]  metFORMIN (GLUCOPHAGE) 500 MG tablet Take 1 tablet (500 mg total) by mouth 2 (two) times daily with a meal. 03/15/21   Federico Flake, MD  metoCLOPramide (REGLAN) 10 MG tablet Take 1 tablet (10 mg total) by mouth 3 (three) times daily before meals. Take 3 times a day for 30 days,Then take 2 a day for 4 days, Then take 1 a day for 4 days, Then stop Patient not taking: Reported on 06/06/2021 03/15/21   Federico Flake, MD  metoCLOPramide (REGLAN) 10 MG tablet Take 1 tablet (10 mg total) by mouth 3 (three) times daily before meals. Take 3 times a day for 30 days,Then take 2 a day for 4 days, Then take 1 a day for 4 days, Then stop Patient not taking: Reported on 06/06/2021 04/21/21   Federico Flake, MD  Prenatal MV  & Min w/FA-DHA (PRENATAL GUMMIES PO) Take 2 tablets by mouth daily.    [provider]  vitamin k 100 MCG tablet Take 100 mcg by mouth daily. Patient not taking: Reported on 08/07/2021    [provider]    Family History Family History  Problem Relation Age of Onset   Hypertension Mother    Arthritis Father     Social History Social History   Tobacco Use   Smoking status: Former    Packs/day: 0.25    Types: Cigarettes   Smokeless tobacco: Never  Vaping Use   Vaping Use: Never used  Substance Use Topics   Alcohol use: No   Drug use: No     Allergies   Ciprofloxacin   Review of Systems Review of Systems Per HPI  Physical Exam Triage Vital Signs ED Triage Vitals  Enc Vitals Group     BP 11/09/21 0848 (!) 152/84     Pulse Rate 11/09/21 0848 83     Resp 11/09/21 0848 18     Temp 11/09/21 0848 98 F (36.7 C)     Temp Source 11/09/21 0848 Oral     SpO2 11/09/21 0848 98 %     Weight --      Height --      Head Circumference --      Peak Flow --      Pain Score 11/09/21 0849 5     Pain Loc --      Pain Edu? --      Excl. in [place]? --    No data found.  Updated Vital Signs BP (!) 152/84 (BP Location: Left Arm)   Pulse 83   Temp 98 F (36.7 C) (Oral)   Resp 18   SpO2 98%   Visual Acuity Right Eye Distance:   Left Eye Distance:   Bilateral Distance:    Right Eye Near:   Left Eye Near:    Bilateral Near:     Physical Exam Constitutional:      General: She is not in acute distress.    Appearance: Normal appearance. She is not toxic-appearing or diaphoretic.  HENT:     Head: Normocephalic and atraumatic.     Right Ear: Tympanic membrane and ear canal normal.     Left Ear: Tympanic membrane and ear canal normal.     Nose: Congestion present.     Mouth/Throat:     Mouth: Mucous membranes are moist.     Pharynx: Posterior oropharyngeal erythema present. No pharyngeal swelling, oropharyngeal exudate or uvula swelling.  Eyes:  Extraocular Movements: Extraocular movements intact.     Conjunctiva/sclera: Conjunctivae normal.     Pupils: Pupils are equal, round, and reactive to light.  Cardiovascular:     Rate and Rhythm: Normal rate and regular rhythm.     Pulses: Normal pulses.     Heart sounds: Normal heart sounds.  Pulmonary:     Effort: Pulmonary effort is normal. No respiratory distress.     Breath sounds: Normal breath sounds. No stridor. No wheezing, rhonchi or rales.  Abdominal:     General: Abdomen is flat. Bowel sounds are normal.     Palpations: Abdomen is soft.  Musculoskeletal:        General: Normal range of motion.     Cervical back: Normal range of motion.  Skin:    General: Skin is warm and dry.  Neurological:     General: No focal deficit present.     Mental Status: She is alert and oriented to person, place, and time. Mental status is at baseline.  Psychiatric:        Mood and Affect: Mood normal.        Behavior: Behavior normal.      UC Treatments / Results  Labs (all labs ordered are listed, but only abnormal results are displayed) Labs Reviewed  CULTURE, GROUP A STREP ([facility])  NOVEL CORONAVIRUS, NAA  POCT RAPID STREP A (OFFICE)    EKG   Radiology No results found.  Procedures Procedures (including critical care time)  Medications Ordered in UC Medications - No data to display  Initial Impression / Assessment and Plan / UC Course  I have reviewed the triage vital signs and the nursing notes.  Pertinent labs & imaging results that were available during my care of the patient were reviewed by me and considered in my medical decision making (see chart for details).     Patient presents with symptoms likely from a viral upper respiratory infection. Differential includes bacterial pneumonia, sinusitis, allergic rhinitis, COVID-19, flu. Do not suspect underlying cardiopulmonary process. Symptoms seem unlikely related to ACS, CHF or COPD exacerbations, pneumonia,  pneumothorax. Patient is nontoxic appearing and not in need of emergent medical intervention.  Rapid strep was negative.  Throat culture and COVID test pending.  Recommended symptom control with over the counter medications.  Patient sent prescriptions.  These medications should be safe as patient declines that she is currently breast-feeding.  Return if symptoms fail to improve in 1-2 weeks or you develop shortness of breath, chest pain, severe headache. Patient states understanding and is agreeable.  Discharged with PCP followup.  Final Clinical Impressions(s) / UC Diagnoses   Final diagnoses:  Viral upper respiratory infection  Sore throat     Discharge Instructions      Your rapid strep was negative.  Throat culture and COVID test are pending.  We will call if they are positive.  It appears that you have a viral illness that should run its course and self resolve with symptomatic treatment as we discussed.  You have been prescribed medications to help alleviate symptoms.  Please follow-up if symptoms persist or worsen.     ED Prescriptions     Medication Sig Dispense Auth. Provider   fluticasone (FLONASE) 50 MCG/ACT nasal spray Place 1 spray into both nostrils daily. 16 g Ervin Knack E, [place]   guaiFENesin 200 MG tablet Take 1 tablet (200 mg total) by mouth every 4 (four) hours as needed for cough or to loosen phlegm. 30 suppository [place],  Acie Fredrickson, FNP      PDMP not reviewed this encounter.   Gustavus Bryant, [place] 11/09/21 [phone]

## 2021-11-09 NOTE — Discharge Instructions (Signed)
Your rapid strep was negative.  Throat culture and COVID test are pending.  We will call if they are positive.  It appears that you have a viral illness that should run its course and self resolve with symptomatic treatment as we discussed.  You have been prescribed medications to help alleviate symptoms.  Please follow-up if symptoms persist or worsen.

## 2021-11-09 NOTE — ED Triage Notes (Signed)
Pt sts sore throat and nasal congestion upon waking this am

## 2021-11-10 LAB — NOVEL CORONAVIRUS, NAA: SARS-CoV-2, NAA: NOT DETECTED

## 2021-11-12 LAB — CULTURE, GROUP A STREP (THRC)

## 2022-02-16 ENCOUNTER — Encounter: Payer: Self-pay | Admitting: Family Medicine

## 2022-03-06 ENCOUNTER — Other Ambulatory Visit: Payer: Self-pay | Admitting: *Deleted

## 2022-03-06 ENCOUNTER — Ambulatory Visit (INDEPENDENT_AMBULATORY_CARE_PROVIDER_SITE_OTHER): Payer: 59

## 2022-03-06 DIAGNOSIS — Z111 Encounter for screening for respiratory tuberculosis: Secondary | ICD-10-CM

## 2022-03-06 DIAGNOSIS — Z23 Encounter for immunization: Secondary | ICD-10-CM

## 2022-03-06 NOTE — Progress Notes (Signed)
Pt presents for Lab and Flu Vaccine only.

## 2022-03-08 ENCOUNTER — Encounter: Payer: Self-pay | Admitting: Emergency Medicine

## 2022-03-08 ENCOUNTER — Ambulatory Visit
Admission: EM | Admit: 2022-03-08 | Discharge: 2022-03-08 | Disposition: A | Discharge status: Home / Self Care | Payer: 59 | Attending: Physician Assistant | Admitting: Physician Assistant

## 2022-03-08 ENCOUNTER — Other Ambulatory Visit: Payer: Self-pay

## 2022-03-08 DIAGNOSIS — M67449 Ganglion, unspecified hand: Secondary | ICD-10-CM

## 2022-03-08 LAB — QUANTIFERON-TB GOLD PLUS
QuantiFERON Mitogen Value: >10 IU/mL
QuantiFERON Nil Value: 0 IU/mL
QuantiFERON TB1 Ag Value: 0.03 IU/mL
QuantiFERON TB2 Ag Value: 0 IU/mL
QuantiFERON-TB Gold Plus: NEGATIVE

## 2022-03-08 MED ORDER — PREDNISONE 20 MG PO TABS: 40.0000 mg | ORAL_TABLET | Freq: Every day | ORAL | 0 refills | Status: AC

## 2022-03-08 NOTE — ED Triage Notes (Signed)
Pt with small bump on left thumb x years that has been painful x 2 days

## 2022-03-08 NOTE — ED Provider Notes (Signed)
[name]    CSN: [id number] Arrival date & time: 03/08/22  [id number]      History   Chief Complaint Chief Complaint  Patient presents with   Hand Pain    HPI Brittney Salazar is a 36 y.o. female.   Patient here today for evaluation of small bump to her left thumb that is been there for years but has been pain for the last 2 days.  She denies any change in size of the lesion.  She has not had any other symptoms and denies any numbness or tingling.  The history is provided by the patient.  Hand Pain Pertinent negatives include no abdominal pain.    Past Medical History:  Diagnosis Date   Hx of metrorrhagia    Hypothyroidism    Left tubal pregnancy with intrauterine pregnancy 05/18/2020   S/p left salpingectomy    Patient Active Problem List   Diagnosis Date Noted   History of cesarean delivery 12/29/2020   History of hypothyroidism 10/13/2020   Alpha thalassemia silent carrier 07/01/2020   BMI 40.0-44.9, adult ([place]) 08/09/2019   Infertility associated with anovulation 08/07/2011   Polycystic ovarian syndrome 08/07/2011   Elevated testosterone level in female 08/07/2011   Vitamin D deficiency 08/07/2011   Hirsutism 08/07/2011    Past Surgical History:  Procedure Laterality Date   CESAREAN SECTION N/A 12/27/2020   Procedure: CESAREAN SECTION;  Surgeon: Truett Mainland, DO;  Location: [facility]S;  Service: Obstetrics;  Laterality: N/A;   SALPINGECTOMY Left    WISDOM TOOTH EXTRACTION      OB History     Gravida  1   Para  1   Term  1   Preterm  0   AB  0   Living  1      SAB  0   IAB  0   Ectopic  0   Multiple  0   Live Births  1            Home Medications    Prior to Admission medications   Medication Sig Start Date End Date Taking? Authorizing Provider  predniSONE (DELTASONE) 20 MG tablet Take 2 tablets (40 mg total) by mouth daily with breakfast for 5 days. 03/08/22 03/13/22 Yes Francene Finders, PA-C  albuterol  (VENTOLIN HFA) 108 (90 Base) MCG/ACT inhaler Inhale 1-2 puffs into the lungs every 6 (six) hours as needed for wheezing or shortness of breath.    [provider]  buPROPion (WELLBUTRIN XL) 150 MG 24 hr tablet Take 1 tablet (150 mg total) by mouth daily. 09/01/21   Caren Macadam, MD  docusate sodium (COLACE) 100 MG capsule Take 1 capsule (100 mg total) by mouth daily as needed for mild constipation. Patient not taking: Reported on 08/07/2021 01/23/21   Anyanwu, Sallyanne Havers, MD  FLOVENT HFA 44 MCG/ACT inhaler Inhale 2 puffs into the lungs 2 (two) times daily. 11/07/20   [provider]  fluticasone (FLONASE) 50 MCG/ACT nasal spray Place 1 spray into both nostrils daily. 11/09/21   Teodora Medici, FNP  guaiFENesin 200 MG tablet Take 1 tablet (200 mg total) by mouth every 4 (four) hours as needed for cough or to loosen phlegm. 11/09/21   Teodora Medici, FNP  hydrOXYzine (ATARAX) 25 MG tablet Take 1 tablet (25 mg total) by mouth every 6 (six) hours as needed for anxiety. 09/01/21   Caren Macadam, MD  levothyroxine (SYNTHROID) 50 MCG tablet Take 50 mcg  by mouth daily before breakfast. Take 100 mcg by mouth on Tuesday and Saturday, take 50 mcg on Monday, Wednesday, Thursday, Friday and Sunday    [provider]  metFORMIN (GLUCOPHAGE) 500 MG tablet Take 1 tablet (500 mg total) by mouth 2 (two) times daily with a meal. 03/15/21   Caren Macadam, MD  metoCLOPramide (REGLAN) 10 MG tablet Take 1 tablet (10 mg total) by mouth 3 (three) times daily before meals. Take 3 times a day for 30 days,Then take 2 a day for 4 days, Then take 1 a day for 4 days, Then stop Patient not taking: Reported on 06/06/2021 03/15/21   Caren Macadam, MD  metoCLOPramide (REGLAN) 10 MG tablet Take 1 tablet (10 mg total) by mouth 3 (three) times daily before meals. Take 3 times a day for 30 days,Then take 2 a day for 4 days, Then take 1 a day for 4 days, Then stop Patient not taking:  Reported on 06/06/2021 04/21/21   Caren Macadam, MD  Prenatal MV & Min w/FA-DHA (PRENATAL GUMMIES PO) Take 2 tablets by mouth daily.    [provider]  vitamin k 100 MCG tablet Take 100 mcg by mouth daily. Patient not taking: Reported on 08/07/2021    [provider]    Family History Family History  Problem Relation Age of Onset   Hypertension Mother    Arthritis Father     Social History Social History   Tobacco Use   Smoking status: Former    Packs/day: 0.25    Types: Cigarettes   Smokeless tobacco: Never  Vaping Use   Vaping Use: Never used  Substance Use Topics   Alcohol use: No   Drug use: No     Allergies   Ciprofloxacin   Review of Systems Review of Systems  Constitutional:  Negative for chills and fever.  Eyes:  Negative for discharge and redness.  Gastrointestinal:  Negative for abdominal pain, nausea and vomiting.  Musculoskeletal:  Negative for arthralgias.  Skin:  Negative for color change.  Neurological:  Negative for numbness.     Physical Exam Triage Vital Signs ED Triage Vitals  Enc Vitals Group     BP 03/08/22 1024 125/71     Pulse Rate 03/08/22 1024 72     Resp 03/08/22 1024 18     Temp 03/08/22 1024 98.3 F (36.8 C)     Temp Source 03/08/22 1024 Oral     SpO2 03/08/22 1024 98 %     Weight --      Height --      Head Circumference --      Peak Flow --      Pain Score 03/08/22 1025 3     Pain Loc --      Pain Edu? --      Excl. in [place]? --    No data found.  Updated Vital Signs BP 125/71 (BP Location: Left Arm)   Pulse 72   Temp 98.3 F (36.8 C) (Oral)   Resp 18   SpO2 98%      Physical Exam Vitals and nursing note reviewed.  Constitutional:      General: She is not in acute distress.    Appearance: Normal appearance. She is not ill-appearing.  HENT:     Head: Normocephalic and atraumatic.  Eyes:     Conjunctiva/sclera: Conjunctivae normal.  Cardiovascular:     Rate and Rhythm: Normal rate.   Pulmonary:  Effort: Pulmonary effort is normal. No respiratory distress.     Breath sounds: Normal breath sounds.  Musculoskeletal:     Comments: Approx 1 cm cyst like lesion somewhat mobile to left palmar thumb, no erythema, mild TTP  Neurological:     Mental Status: She is alert.  Psychiatric:        Mood and Affect: Mood normal.        Behavior: Behavior normal.        Thought Content: Thought content normal.      UC Treatments / Results  Labs (all labs ordered are listed, but only abnormal results are displayed) Labs Reviewed - No data to display  EKG   Radiology No results found.  Procedures Procedures (including critical care time)  Medications Ordered in UC Medications - No data to display  Initial Impression / Assessment and Plan / UC Course  I have reviewed the triage vital signs and the nursing notes.  Pertinent labs & imaging results that were available during my care of the patient were reviewed by me and considered in my medical decision making (see chart for details).    Expect likely ganglion cyst- will treat with steroids to hopefully decrease inflammation- recommend follow up with ortho. Patient is agreeable to same.   Final Clinical Impressions(s) / UC Diagnoses   Final diagnoses:  Ganglion cyst of finger   Discharge Instructions   None    ED Prescriptions     Medication Sig Dispense Auth. Provider   predniSONE (DELTASONE) 20 MG tablet Take 2 tablets (40 mg total) by mouth daily with breakfast for 5 days. 10 tablet Tomi Bamberger, PA-C      PDMP not reviewed this encounter.   Tomi Bamberger, PA-C 03/08/22 1145

## 2022-05-08 ENCOUNTER — Ambulatory Visit
Admission: RE | Admit: 2022-05-08 | Discharge: 2022-05-08 | Disposition: A | Discharge status: Home / Self Care | Payer: 59 | Source: Ambulatory Visit | Attending: Urgent Care | Admitting: Urgent Care

## 2022-05-08 VITALS — BP 114/75 | HR 65 | Temp 97.8°F | Resp 18

## 2022-05-08 DIAGNOSIS — S39012A Strain of muscle, fascia and tendon of lower back, initial encounter: Secondary | ICD-10-CM

## 2022-05-08 DIAGNOSIS — M545 Low back pain, unspecified: Secondary | ICD-10-CM | POA: Diagnosis not present

## 2022-05-08 LAB — POCT URINALYSIS DIP (MANUAL ENTRY)
Bilirubin, UA: NEGATIVE
Blood, UA: NEGATIVE
Glucose, UA: NEGATIVE mg/dL
Ketones, POC UA: NEGATIVE mg/dL
Leukocytes, UA: NEGATIVE
Nitrite, UA: NEGATIVE
Protein Ur, POC: NEGATIVE mg/dL
Spec Grav, UA: 1.02 (ref 1.010–1.025)
Urobilinogen, UA: 0.2 E.U./dL
pH, UA: 7.5 (ref 5.0–8.0)

## 2022-05-08 LAB — POCT URINE PREGNANCY: Preg Test, Ur: NEGATIVE

## 2022-05-08 MED ORDER — TIZANIDINE HCL 4 MG PO TABS: 4.0000 mg | ORAL_TABLET | Freq: Every day | ORAL | 0 refills | Status: DC

## 2022-05-08 MED ORDER — KETOROLAC TROMETHAMINE 60 MG/2ML IM SOLN
60.0000 mg | Freq: Once | INTRAMUSCULAR | Status: AC
  Administered 2022-05-08: 60 mg via INTRAMUSCULAR

## 2022-05-08 MED ORDER — NAPROXEN 500 MG PO TABS: 500.0000 mg | ORAL_TABLET | Freq: Two times a day (BID) | ORAL | 0 refills | Status: DC

## 2022-05-08 NOTE — ED Triage Notes (Signed)
Pt reports having lower back pain (pressure). Pt denies changes to urination pattern and denies abd pain.   Started: this morning   Home interventions: massage, icy/ hot patch

## 2022-05-08 NOTE — ED Provider Notes (Signed)
[facility]  Note:  This document was prepared using Systems analyst and may include unintentional dictation errors.  MRN: [id number] DOB: 15-Sep-1985  Subjective:   Brittney Salazar is a 37 y.o. female presenting for acute onset since this morning of mid-left lower back pain, mostly constant, sharp in nature, rated 8/10. No fall, trauma, numbness or tingling, saddle paresthesia, changes to bowel or urinary habits, radicular symptoms. Denies fever, n/v, abdominal pain, pelvic pain, rashes, dysuria, urinary frequency, hematuria, vaginal discharge.  Patient does a lot of standing and lifting for her work at the [employer].  She also has a 27-year-old that she is very active with.  No current facility-administered medications for this encounter.  Current Outpatient Medications:    albuterol (VENTOLIN HFA) 108 (90 Base) MCG/ACT inhaler, Inhale 1-2 puffs into the lungs every 6 (six) hours as needed for wheezing or shortness of breath., Disp: , Rfl:    buPROPion (WELLBUTRIN XL) 150 MG 24 hr tablet, Take 1 tablet (150 mg total) by mouth daily., Disp: 30 tablet, Rfl: 3   docusate sodium (COLACE) 100 MG capsule, Take 1 capsule (100 mg total) by mouth daily as needed for mild constipation. (Patient not taking: Reported on 08/07/2021), Disp: 30 capsule, Rfl: 2   FLOVENT HFA 44 MCG/ACT inhaler, Inhale 2 puffs into the lungs 2 (two) times daily., Disp: , Rfl:    fluticasone (FLONASE) 50 MCG/ACT nasal spray, Place 1 spray into both nostrils daily., Disp: 16 g, Rfl: 0   guaiFENesin 200 MG tablet, Take 1 tablet (200 mg total) by mouth every 4 (four) hours as needed for cough or to loosen phlegm., Disp: 30 suppository, Rfl: 0   hydrOXYzine (ATARAX) 25 MG tablet, Take 1 tablet (25 mg total) by mouth every 6 (six) hours as needed for anxiety., Disp: 30 tablet, Rfl: 2   levothyroxine (SYNTHROID) 50 MCG tablet, Take 50 mcg by mouth daily before breakfast. Take 100 mcg by mouth  on Tuesday and Saturday, take 50 mcg on Monday, Wednesday, Thursday, Friday and Sunday, Disp: , Rfl:    metFORMIN (GLUCOPHAGE) 500 MG tablet, Take 1 tablet (500 mg total) by mouth 2 (two) times daily with a meal., Disp: 60 tablet, Rfl: 6   metoCLOPramide (REGLAN) 10 MG tablet, Take 1 tablet (10 mg total) by mouth 3 (three) times daily before meals. Take 3 times a day for 30 days,Then take 2 a day for 4 days, Then take 1 a day for 4 days, Then stop (Patient not taking: Reported on 06/06/2021), Disp: 102 tablet, Rfl: 0   metoCLOPramide (REGLAN) 10 MG tablet, Take 1 tablet (10 mg total) by mouth 3 (three) times daily before meals. Take 3 times a day for 30 days,Then take 2 a day for 4 days, Then take 1 a day for 4 days, Then stop (Patient not taking: Reported on 06/06/2021), Disp: 102 tablet, Rfl: 0   Prenatal MV & Min w/FA-DHA (PRENATAL GUMMIES PO), Take 2 tablets by mouth daily., Disp: , Rfl:    vitamin k 100 MCG tablet, Take 100 mcg by mouth daily. (Patient not taking: Reported on 08/07/2021), Disp: , Rfl:    Allergies  Allergen Reactions   Ciprofloxacin Hives    Past Medical History:  Diagnosis Date   Hx of metrorrhagia    Hypothyroidism    Left tubal pregnancy with intrauterine pregnancy 05/18/2020   S/p left salpingectomy     Past Surgical History:  Procedure Laterality Date   CESAREAN  SECTION N/A 12/27/2020   Procedure: CESAREAN SECTION;  Surgeon: Levie Heritage, DO;  Location: [facility];  Service: Obstetrics;  Laterality: N/A;   SALPINGECTOMY Left    WISDOM TOOTH EXTRACTION      Family History  Problem Relation Age of Onset   Hypertension Mother    Arthritis Father     Social History   Tobacco Use   Smoking status: Former    Packs/day: 0.25    Types: Cigarettes   Smokeless tobacco: Never  Vaping Use   Vaping Use: Never used  Substance Use Topics   Alcohol use: No   Drug use: No    ROS   Objective:   Vitals: BP 114/75 (BP Location: Right Arm)   Pulse 65   Temp  97.8 F (36.6 C) (Oral)   Resp 18   LMP 04/18/2022 (Approximate)   SpO2 97%   Physical Exam Constitutional:      General: She is not in acute distress.    Appearance: Normal appearance. She is well-developed. She is not ill-appearing, toxic-appearing or diaphoretic.  HENT:     Head: Normocephalic and atraumatic.     Nose: Nose normal.     Mouth/Throat:     Mouth: Mucous membranes are moist.  Eyes:     General: No scleral icterus.       Right eye: No discharge.        Left eye: No discharge.     Extraocular Movements: Extraocular movements intact.  Cardiovascular:     Rate and Rhythm: Normal rate.  Pulmonary:     Effort: Pulmonary effort is normal.  Musculoskeletal:     Lumbar back: Spasms and tenderness (over area outlined) present. No swelling, edema, deformity, signs of trauma, lacerations or bony tenderness. Normal range of motion. Negative right straight leg raise test and negative left straight leg raise test. No scoliosis.       Back:  Skin:    General: Skin is warm and dry.  Neurological:     General: No focal deficit present.     Mental Status: She is alert and oriented to person, place, and time.  Psychiatric:        Mood and Affect: Mood normal.        Behavior: Behavior normal.     Results for orders placed or performed during the hospital encounter of 05/08/22 (from the past 24 hour(s))  POCT urinalysis dipstick     Status: None   Collection Time: 05/08/22 10:59 AM  Result Value Ref Range   Color, UA yellow yellow   Clarity, UA clear clear   Glucose, UA negative negative mg/dL   Bilirubin, UA negative negative   Ketones, POC UA negative negative mg/dL   Spec Grav, UA 3.244 0.102 - 1.025   Blood, UA negative negative   pH, UA 7.5 5.0 - 8.0   Protein Ur, POC negative negative mg/dL   Urobilinogen, UA 0.2 0.2 or 1.0 E.U./dL   Nitrite, UA Negative Negative   Leukocytes, UA Negative Negative  POCT urine pregnancy     Status: None   Collection Time:  05/08/22 10:59 AM  Result Value Ref Range   Preg Test, Ur Negative Negative   IM Toradol in clinic at 60 mg.  Assessment and Plan :   PDMP not reviewed this encounter.  1. Acute left-sided low back pain without sciatica   2. Lumbar strain, initial encounter     Will manage conservatively for back strain with NSAID and  muscle relaxant, rest and modification of physical activity.  Anticipatory guidance provided.  Counseled patient on potential for adverse effects with medications prescribed/recommended today, ER and return-to-clinic precautions discussed, patient verbalized understanding.    Jaynee Eagles, [place] 05/08/22 1114

## 2022-05-22 ENCOUNTER — Encounter: Payer: Self-pay | Admitting: Family Medicine

## 2022-07-29 ENCOUNTER — Encounter: Payer: Self-pay | Admitting: Emergency Medicine

## 2022-07-29 ENCOUNTER — Other Ambulatory Visit: Payer: Self-pay

## 2022-07-29 ENCOUNTER — Ambulatory Visit
Admission: EM | Admit: 2022-07-29 | Discharge: 2022-07-29 | Disposition: A | Discharge status: Home / Self Care | Payer: 59 | Attending: Internal Medicine | Admitting: Internal Medicine

## 2022-07-29 DIAGNOSIS — Z23 Encounter for immunization: Secondary | ICD-10-CM | POA: Diagnosis not present

## 2022-07-29 DIAGNOSIS — S61210A Laceration without foreign body of right index finger without damage to nail, initial encounter: Secondary | ICD-10-CM

## 2022-07-29 MED ORDER — TETANUS-DIPHTH-ACELL PERTUSSIS 5-2.5-18.5 LF-MCG/0.5 IM SUSY
0.5000 mL | PREFILLED_SYRINGE | Freq: Once | INTRAMUSCULAR | Status: AC
  Administered 2022-07-29: 0.5 mL via INTRAMUSCULAR

## 2022-07-29 NOTE — ED Provider Notes (Signed)
[name]    CSN: [id number] Arrival date & time: 07/29/22  1054      History   Chief Complaint Chief Complaint  Patient presents with   Laceration    HPI Brittney Salazar is a 37 y.o. female.   Patient presents with laceration to right index finger that occurred approximately 2 days ago.  Patient reports that she accidentally hit her finger while using a cheese grater twice.  Patient is not sure of last tetanus vaccine but reports it is most likely over 5 years.  Denies numbness or tingling.  Has full range of motion of finger.  Reports that she has been cleaning it daily and changing the dressing.   Laceration   Past Medical History:  Diagnosis Date   Hx of metrorrhagia    Hypothyroidism    Left tubal pregnancy with intrauterine pregnancy 05/18/2020   S/p left salpingectomy    Patient Active Problem List   Diagnosis Date Noted   History of cesarean delivery 12/29/2020   History of hypothyroidism 10/13/2020   Alpha thalassemia silent carrier 07/01/2020   BMI 40.0-44.9, adult 08/09/2019   Infertility associated with anovulation 08/07/2011   Polycystic ovarian syndrome 08/07/2011   Elevated testosterone level in female 08/07/2011   Vitamin D deficiency 08/07/2011   Hirsutism 08/07/2011    Past Surgical History:  Procedure Laterality Date   CESAREAN SECTION N/A 12/27/2020   Procedure: CESAREAN SECTION;  Surgeon: Levie Heritage, DO;  Location: [facility];  Service: Obstetrics;  Laterality: N/A;   SALPINGECTOMY Left    WISDOM TOOTH EXTRACTION      OB History     Gravida  1   Para  1   Term  1   Preterm  0   AB  0   Living  1      SAB  0   IAB  0   Ectopic  0   Multiple  0   Live Births  1            Home Medications    Prior to Admission medications   Medication Sig Start Date End Date Taking? Authorizing Provider  albuterol (VENTOLIN HFA) 108 (90 Base) MCG/ACT inhaler Inhale 1-2 puffs into the lungs every 6 (six) hours  as needed for wheezing or shortness of breath.    [provider]  buPROPion (WELLBUTRIN XL) 150 MG 24 hr tablet Take 1 tablet (150 mg total) by mouth daily. 09/01/21   Federico Flake, MD  docusate sodium (COLACE) 100 MG capsule Take 1 capsule (100 mg total) by mouth daily as needed for mild constipation. Patient not taking: Reported on 08/07/2021 01/23/21   Anyanwu, Jethro Bastos, MD  FLOVENT HFA 44 MCG/ACT inhaler Inhale 2 puffs into the lungs 2 (two) times daily. 11/07/20   [provider]  fluticasone (FLONASE) 50 MCG/ACT nasal spray Place 1 spray into both nostrils daily. 11/09/21   Gustavus Bryant, FNP  guaiFENesin 200 MG tablet Take 1 tablet (200 mg total) by mouth every 4 (four) hours as needed for cough or to loosen phlegm. 11/09/21   Gustavus Bryant, FNP  hydrOXYzine (ATARAX) 25 MG tablet Take 1 tablet (25 mg total) by mouth every 6 (six) hours as needed for anxiety. 09/01/21   Federico Flake, MD  levothyroxine (SYNTHROID) 50 MCG tablet Take 50 mcg by mouth daily before breakfast. Take 100 mcg by mouth on Tuesday and Saturday, take 50 mcg on Monday, Wednesday, Thursday, Friday  and Sunday    [provider]  metFORMIN (GLUCOPHAGE) 500 MG tablet Take 1 tablet (500 mg total) by mouth 2 (two) times daily with a meal. 03/15/21   Federico Flake, MD  metoCLOPramide (REGLAN) 10 MG tablet Take 1 tablet (10 mg total) by mouth 3 (three) times daily before meals. Take 3 times a day for 30 days,Then take 2 a day for 4 days, Then take 1 a day for 4 days, Then stop Patient not taking: Reported on 06/06/2021 03/15/21   Federico Flake, MD  metoCLOPramide (REGLAN) 10 MG tablet Take 1 tablet (10 mg total) by mouth 3 (three) times daily before meals. Take 3 times a day for 30 days,Then take 2 a day for 4 days, Then take 1 a day for 4 days, Then stop Patient not taking: Reported on 06/06/2021 04/21/21   Federico Flake, MD  naproxen (NAPROSYN) 500 MG tablet Take 1  tablet (500 mg total) by mouth 2 (two) times daily with a meal. 05/08/22   Wallis Bamberg, PA-C  Prenatal MV & Min w/FA-DHA (PRENATAL GUMMIES PO) Take 2 tablets by mouth daily.    [provider]  tiZANidine (ZANAFLEX) 4 MG tablet Take 1 tablet (4 mg total) by mouth at bedtime. 05/08/22   Wallis Bamberg, PA-C  vitamin k 100 MCG tablet Take 100 mcg by mouth daily. Patient not taking: Reported on 08/07/2021    [provider]    Family History Family History  Problem Relation Age of Onset   Hypertension Mother    Arthritis Father     Social History Social History   Tobacco Use   Smoking status: Former    Packs/day: .25    Types: Cigarettes   Smokeless tobacco: Never  Vaping Use   Vaping Use: Never used  Substance Use Topics   Alcohol use: No   Drug use: No     Allergies   Ciprofloxacin   Review of Systems Review of Systems Per HPI  Physical Exam Triage Vital Signs ED Triage Vitals [07/29/22 1145]  Enc Vitals Group     BP 122/66     Pulse Rate 74     Resp 18     Temp 98 F (36.7 C)     Temp Source Oral     SpO2 98 %     Weight      Height      Head Circumference      Peak Flow      Pain Score 5     Pain Loc      Pain Edu?      Excl. in GC?    No data found.  Updated Vital Signs BP 122/66 (BP Location: Left Arm)   Pulse 74   Temp 98 F (36.7 C) (Oral)   Resp 18   SpO2 98%   Visual Acuity Right Eye Distance:   Left Eye Distance:   Bilateral Distance:    Right Eye Near:   Left Eye Near:    Bilateral Near:     Physical Exam Constitutional:      General: She is not in acute distress.    Appearance: Normal appearance. She is not toxic-appearing or diaphoretic.  HENT:     Head: Normocephalic and atraumatic.  Eyes:     Extraocular Movements: Extraocular movements intact.     Conjunctiva/sclera: Conjunctivae normal.  Pulmonary:     Effort: Pulmonary effort is normal.  Skin:    Comments: Patient has very  small avulsion laceration  that is approximately 1 to 2 mm present to the medial PIP joint.  No bleeding noted.  No signs of infection.  Patient has full range of motion of finger.  Capillary refill intact.  Neurovascularly intact.  Neurological:     General: No focal deficit present.     Mental Status: She is alert and oriented to person, place, and time. Mental status is at baseline.  Psychiatric:        Mood and Affect: Mood normal.        Behavior: Behavior normal.        Thought Content: Thought content normal.        Judgment: Judgment normal.      UC Treatments / Results  Labs (all labs ordered are listed, but only abnormal results are displayed) Labs Reviewed - No data to display  EKG   Radiology No results found.  Procedures Procedures (including critical care time)  Medications Ordered in UC Medications  Tdap (BOOSTRIX) injection 0.5 mL (0.5 mLs Intramuscular Given 07/29/22 1207)    Initial Impression / Assessment and Plan / UC Course  I have reviewed the triage vital signs and the nursing notes.  Pertinent labs & imaging results that were available during my care of the patient were reviewed by me and considered in my medical decision making (see chart for details).     She has a very small avulsion laceration present to the right index finger.  There is no sutures or closure needed.  No signs of infection or complication.  Patient is neurovascularly intact and has full range of motion of finger.  Do not think imaging is necessary given no concern for foreign body or bony abnormality.  Nonadherent Dressing and antibiotic ointment applied by clinical staff.  Advised daily dressing changes and monitoring for signs of infection.  Tetanus vaccine updated today.  Advised return precautions.  Patient verbalized understanding and was agreeable with plan. Final Clinical Impressions(s) / UC Diagnoses   Final diagnoses:  Laceration of right index finger without foreign body without damage to nail,  initial encounter     Discharge Instructions      Change dressing daily.  Monitor for signs of infection that include increased redness, swelling, pus.  Tetanus vaccine was updated today.  Follow-up if any symptoms persist or worsen.    ED Prescriptions   None    PDMP not reviewed this encounter.   Gustavus BryantMound, Caio Devera E, OregonFNP 07/29/22 [phone]

## 2022-07-29 NOTE — Discharge Instructions (Signed)
Change dressing daily.  Monitor for signs of infection that include increased redness, swelling, pus.  Tetanus vaccine was updated today.  Follow-up if any symptoms persist or worsen.

## 2022-07-29 NOTE — ED Triage Notes (Signed)
Pt here for laceration to right 2nd digit that happened 2 days ago from cheese grater; pt sts some bleeding yesterday but unsure if still oozing today; bleeding well controlled with bandage at present

## 2022-10-10 ENCOUNTER — Ambulatory Visit
Admission: EM | Admit: 2022-10-10 | Discharge: 2022-10-10 | Disposition: A | Discharge status: Home / Self Care | Payer: 59 | Attending: Emergency Medicine | Admitting: Emergency Medicine

## 2022-10-10 DIAGNOSIS — Z1152 Encounter for screening for COVID-19: Secondary | ICD-10-CM | POA: Diagnosis not present

## 2022-10-10 DIAGNOSIS — R0981 Nasal congestion: Secondary | ICD-10-CM | POA: Diagnosis present

## 2022-10-10 MED ORDER — FLUTICASONE PROPIONATE 50 MCG/ACT NA SUSP: 2.0000 | Freq: Every day | NASAL | 0 refills | Status: AC

## 2022-10-10 NOTE — ED Provider Notes (Signed)
[name]    CSN: [id number] Arrival date & time: 10/10/22  0858      History   Chief Complaint Chief Complaint  Patient presents with   Nasal Congestion   Nausea    HPI Brittney Salazar is a 37 y.o. female.  She felt fine yesterday.  She woke up today with nasal congestion and nausea.  Vigorously blew her nose this morning trying to remove congestion and caused it to bleed a little bit.  No bleeding at this time.  Reports she vomited once and said it was a small amount of food.  Then she stated she vomited before eating.  After vomiting, she ate sausages and it did not make her feel worse in terms of nausea and no further vomiting.  Denies any other symptoms.  She is not sure if she feels ill or not.  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  HPI  Past Medical History:  Diagnosis Date   Hx of metrorrhagia    Hypothyroidism    Left tubal pregnancy with intrauterine pregnancy 05/18/2020   S/p left salpingectomy    Patient Active Problem List   Diagnosis Date Noted   History of cesarean delivery 12/29/2020   History of hypothyroidism 10/13/2020   Alpha thalassemia silent carrier 07/01/2020   BMI 40.0-44.9, adult (HCC) 08/09/2019   Infertility associated with anovulation 08/07/2011   Polycystic ovarian syndrome 08/07/2011   Elevated testosterone level in female 08/07/2011   Vitamin D deficiency 08/07/2011   Hirsutism 08/07/2011    Past Surgical History:  Procedure Laterality Date   CESAREAN SECTION N/A 12/27/2020   Procedure: CESAREAN SECTION;  Surgeon: Levie Heritage, DO;  Location: [facility];  Service: Obstetrics;  Laterality: N/A;   SALPINGECTOMY Left    WISDOM TOOTH EXTRACTION      OB History     Gravida  1   Para  1   Term  1   Preterm  0   AB  0   Living  1      SAB  0   IAB  0   Ectopic  0   Multiple  0   Live Births  1             Home Medications    Prior to Admission medications   Medication Sig Start Date End Date Taking? Authorizing Provider  fluticasone (FLONASE) 50 MCG/ACT nasal spray Place 2 sprays into both nostrils daily. 10/10/22  Yes Cathlyn Parsons, NP  Prenatal MV & Min w/FA-DHA (PRENATAL GUMMIES PO) Take 2 tablets by mouth daily.    [provider]    Family History Family History  Problem Relation Age of Onset   Hypertension Mother    Arthritis Father     Social History Social History   Tobacco Use   Smoking status: Former    Packs/day: .25    Types: Cigarettes   Smokeless tobacco: Never  Vaping Use   Vaping Use: Never used  Substance Use Topics   Alcohol use: No   Drug use: No     Allergies   Ciprofloxacin   Review of Systems Review of Systems   Physical Exam Triage Vital Signs ED Triage Vitals  Enc Vitals Group     BP 10/10/22 1054 121/81     Pulse Rate 10/10/22 1054 65     Resp  10/10/22 1054 18     Temp 10/10/22 1054 98 F (36.7 C)     Temp Source 10/10/22 1054 Oral     SpO2 10/10/22 1054 98 %     Weight --      Height --      Head Circumference --      Peak Flow --      Pain Score 10/10/22 1055 2     Pain Loc --      Pain Edu? --      Excl. in GC? --    No data found.  Updated Vital Signs BP 121/81 (BP Location: Left Arm)   Pulse 65   Temp 98 F (36.7 C) (Oral)   Resp 18   LMP 09/11/2022 (Exact Date)   SpO2 98%   Visual Acuity Right Eye Distance:   Left Eye Distance:   Bilateral Distance:    Right Eye Near:   Left Eye Near:    Bilateral Near:     Physical Exam Constitutional:      General: She is not in acute distress.    Appearance: Normal appearance. She is obese. She is not ill-appearing.  HENT:     Right Ear: Tympanic membrane, ear canal and external ear normal.     Left Ear: Tympanic membrane, ear canal and external ear normal.     Nose: Congestion present. No rhinorrhea.     Mouth/Throat:     Mouth: Mucous membranes  are moist.     Pharynx: Oropharynx is clear.  Cardiovascular:     Rate and Rhythm: Normal rate and regular rhythm.  Pulmonary:     Effort: Pulmonary effort is normal.     Breath sounds: Normal breath sounds.  Abdominal:     General: Abdomen is flat.     Palpations: Abdomen is soft.     Tenderness: There is no abdominal tenderness. There is no guarding or rebound.  Lymphadenopathy:     Head:     Right side of head: No submandibular adenopathy.     Left side of head: No submandibular adenopathy.     Cervical: No cervical adenopathy.  Neurological:     Mental Status: She is alert.      UC Treatments / Results  Labs (all labs ordered are listed, but only abnormal results are displayed) Labs Reviewed  SARS CORONAVIRUS 2 (TAT 6-24 HRS)    EKG   Radiology No results found.  Procedures Procedures (including critical care time)  Medications Ordered in UC Medications - No data to display  Initial Impression / Assessment and Plan / UC Course  I have reviewed the triage vital signs and the nursing notes.  Pertinent labs & imaging results that were available during my care of the patient were reviewed by me and considered in my medical decision making (see chart for details).    Patient appears well.  However, she could be an early viral illness.  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  Results pending.  Will treat with supportive care for now.  Recommended nasal saline, steroid nasal spray, antihistamine.  As patient was able to eat after her vomiting episode, I do not suspect that she has a GI illness at this time.  Final Clinical Impressions(s) / UC Diagnoses   Final diagnoses:  Nasal congestion     Discharge Instructions      I have prescribed Flonase (fluticasone) nasal spray for you to  use to help your nasal congestion.  Nasal saline spray will also really help relieve congestion in your nose.  It safe to use it  several times a day if you need to, just make sure you use it at a separate time at the prescription nasal spray so you do not wash the medicine out of your nose.  You might also try taking Zyrtec (generic cetirizine okay to use) or Claritin (generic loratadine okay to use) to help your nasal congestion as well if it is related to allergies.  COVID results should come back tomorrow.  You will get a call if it is positive, you will not get a call if it is negative.  If you have a MyChart account, you can check your results on MyChart.  For your nausea/vomiting: Eat bland, easy-to-digest foods in small amounts as you are able. These foods include bananas, applesauce, rice, lean meats, toast, and crackers. Drink clear fluids slowly and in small amounts as you are able. Clear fluids include water, ice chips, low-calorie sports drinks, and fruit juice that has water added (diluted fruit juice). Avoid drinking fluids that contain a lot of sugar or caffeine, such as energy drinks, sports drinks, and soda. Avoid alcohol. Avoid spicy or fatty foods.   ED Prescriptions     Medication Sig Dispense Auth. Provider   fluticasone (FLONASE) 50 MCG/ACT nasal spray Place 2 sprays into both nostrils daily. 16 g Cathlyn Parsons, NP      PDMP not reviewed this encounter.   Cathlyn Parsons, NP 10/10/22 1228

## 2022-10-10 NOTE — Discharge Instructions (Signed)
I have prescribed Flonase (fluticasone) nasal spray for you to use to help your nasal congestion.  Nasal saline spray will also really help relieve congestion in your nose.  It safe to use it several times a day if you need to, just make sure you use it at a separate time at the prescription nasal spray so you do not wash the medicine out of your nose.  You might also try taking Zyrtec (generic cetirizine okay to use) or Claritin (generic loratadine okay to use) to help your nasal congestion as well if it is related to allergies.  COVID results should come back tomorrow.  You will get a call if it is positive, you will not get a call if it is negative.  If you have a MyChart account, you can check your results on MyChart.  For your nausea/vomiting: Eat bland, easy-to-digest foods in small amounts as you are able. These foods include bananas, applesauce, rice, lean meats, toast, and crackers. Drink clear fluids slowly and in small amounts as you are able. Clear fluids include water, ice chips, low-calorie sports drinks, and fruit juice that has water added (diluted fruit juice). Avoid drinking fluids that contain a lot of sugar or caffeine, such as energy drinks, sports drinks, and soda. Avoid alcohol. Avoid spicy or fatty foods.

## 2022-10-10 NOTE — ED Triage Notes (Signed)
Patient with c/o congestion and nausea since this morning. States she threw up one time and her mucus from her nose looked a little blood tinged.

## 2022-10-11 LAB — SARS CORONAVIRUS 2 (TAT 6-24 HRS): SARS Coronavirus 2: NEGATIVE

## 2023-01-21 ENCOUNTER — Ambulatory Visit
Admission: RE | Admit: 2023-01-21 | Discharge: 2023-01-21 | Disposition: A | Discharge status: Home / Self Care | Payer: 59 | Source: Ambulatory Visit | Attending: Internal Medicine | Admitting: Internal Medicine

## 2023-01-21 VITALS — BP 129/82 | HR 77 | Temp 98.3°F | Resp 16

## 2023-01-21 DIAGNOSIS — J029 Acute pharyngitis, unspecified: Secondary | ICD-10-CM | POA: Diagnosis present

## 2023-01-21 DIAGNOSIS — Z1152 Encounter for screening for COVID-19: Secondary | ICD-10-CM | POA: Insufficient documentation

## 2023-01-21 DIAGNOSIS — B9789 Other viral agents as the cause of diseases classified elsewhere: Secondary | ICD-10-CM | POA: Diagnosis not present

## 2023-01-21 DIAGNOSIS — J069 Acute upper respiratory infection, unspecified: Secondary | ICD-10-CM | POA: Diagnosis not present

## 2023-01-21 LAB — POCT RAPID STREP A (OFFICE): Rapid Strep A Screen: NEGATIVE

## 2023-01-21 MED ORDER — IPRATROPIUM BROMIDE 0.03 % NA SOLN: 2.0000 | Freq: Two times a day (BID) | NASAL | 0 refills | Status: AC

## 2023-01-21 MED ORDER — PROMETHAZINE-DM 6.25-15 MG/5ML PO SYRP: 5.0000 mL | ORAL_SOLUTION | Freq: Four times a day (QID) | ORAL | 0 refills | Status: AC | PRN

## 2023-01-21 NOTE — ED Provider Notes (Signed)
[facility]    CSN: [id number] Arrival date & time: 01/21/23  1056      History   Chief Complaint Chief Complaint  Patient presents with   Sore Throat    Nasal congestion, dry cough, slight cramps in pelvic area, wheezing - Entered by patient    HPI Brittney Salazar is a 37 y.o. female  presents for evaluation of URI symptoms for 4 days. Patient reports associated symptoms of cough, congestion, sore throat, abdominal pain with coughing. Denies N/V/D, fevers, ear pain, body aches, shortness of breath. Patient does not have a hx of asthma. Patient does not have a history of smoking.  Reports  sick contacts via family members.  Pt has taken TheraFlu OTC for symptoms. Pt has no other concerns at this time.    Sore Throat    Past Medical History:  Diagnosis Date   Hx of metrorrhagia    Hypothyroidism    Left tubal pregnancy with intrauterine pregnancy 05/18/2020   S/p left salpingectomy    Patient Active Problem List   Diagnosis Date Noted   History of cesarean delivery 12/29/2020   History of hypothyroidism 10/13/2020   Alpha thalassemia silent carrier 07/01/2020   BMI 40.0-44.9, adult (HCC) 08/09/2019   Infertility associated with anovulation 08/07/2011   Polycystic ovarian syndrome 08/07/2011   Elevated testosterone level in female 08/07/2011   Vitamin D deficiency 08/07/2011   Hirsutism 08/07/2011    Past Surgical History:  Procedure Laterality Date   CESAREAN SECTION N/A 12/27/2020   Procedure: CESAREAN SECTION;  Surgeon: Levie Heritage, DO;  Location: [facility];  Service: Obstetrics;  Laterality: N/A;   SALPINGECTOMY Left    WISDOM TOOTH EXTRACTION      OB History     Gravida  1   Para  1   Term  1   Preterm  0   AB  0   Living  1      SAB  0   IAB  0   Ectopic  0   Multiple  0   Live Births  1            Home Medications    Prior to Admission medications   Medication Sig Start Date End Date Taking? Authorizing  Provider  ipratropium (ATROVENT) 0.03 % nasal spray Place 2 sprays into both nostrils every 12 (twelve) hours. 01/21/23  Yes Radford Pax, NP  promethazine-dextromethorphan (PROMETHAZINE-DM) 6.25-15 MG/5ML syrup Take 5 mLs by mouth 4 (four) times daily as needed for cough. 01/21/23  Yes Radford Pax, NP  fluticasone (FLONASE) 50 MCG/ACT nasal spray Place 2 sprays into both nostrils daily. 10/10/22   Cathlyn Parsons, NP  Prenatal MV & Min w/FA-DHA (PRENATAL GUMMIES PO) Take 2 tablets by mouth daily.    [provider]    Family History Family History  Problem Relation Age of Onset   Hypertension Mother    Arthritis Father     Social History Social History   Tobacco Use   Smoking status: Former    Current packs/day: 0.25    Types: Cigarettes   Smokeless tobacco: Never  Vaping Use   Vaping status: Never Used  Substance Use Topics   Alcohol use: No   Drug use: No     Allergies   Ciprofloxacin   Review of Systems Review of Systems  HENT:  Positive for congestion and sore throat.   Respiratory:  Positive for cough.  Physical Exam Triage Vital Signs ED Triage Vitals  Encounter Vitals Group     BP 01/21/23 1106 129/82     Systolic BP Percentile --      Diastolic BP Percentile --      Pulse Rate 01/21/23 1106 77     Resp 01/21/23 1106 16     Temp 01/21/23 1106 98.3 F (36.8 C)     Temp Source 01/21/23 1106 Oral     SpO2 01/21/23 1106 98 %     Weight --      Height --      Head Circumference --      Peak Flow --      Pain Score 01/21/23 1104 4     Pain Loc --      Pain Education --      Exclude from Growth Chart --    No data found.  Updated Vital Signs BP 129/82 (BP Location: Right Arm)   Pulse 77   Temp 98.3 F (36.8 C) (Oral)   Resp 16   LMP 01/06/2023 (Exact Date)   SpO2 98%   Breastfeeding No   Visual Acuity Right Eye Distance:   Left Eye Distance:   Bilateral Distance:    Right Eye Near:   Left Eye Near:    Bilateral Near:      Physical Exam Vitals and nursing note reviewed.  Constitutional:      General: She is not in acute distress.    Appearance: She is well-developed. She is not ill-appearing.  HENT:     Head: Normocephalic and atraumatic.     Right Ear: Tympanic membrane and ear canal normal.     Left Ear: Tympanic membrane and ear canal normal.     Nose: Congestion present.     Mouth/Throat:     Mouth: Mucous membranes are moist.     Pharynx: Oropharynx is clear. Uvula midline. Posterior oropharyngeal erythema present.     Tonsils: No tonsillar exudate or tonsillar abscesses.  Eyes:     Conjunctiva/sclera: Conjunctivae normal.     Pupils: Pupils are equal, round, and reactive to light.  Cardiovascular:     Rate and Rhythm: Normal rate and regular rhythm.     Heart sounds: Normal heart sounds.  Pulmonary:     Effort: Pulmonary effort is normal.     Breath sounds: Normal breath sounds.  Musculoskeletal:     Cervical back: Normal range of motion and neck supple.  Lymphadenopathy:     Cervical: No cervical adenopathy.  Skin:    General: Skin is warm and dry.  Neurological:     General: No focal deficit present.     Mental Status: She is alert and oriented to person, place, and time.  Psychiatric:        Mood and Affect: Mood normal.        Behavior: Behavior normal.      UC Treatments / Results  Labs (all labs ordered are listed, but only abnormal results are displayed) Labs Reviewed  SARS CORONAVIRUS 2 (TAT 6-24 HRS)  CULTURE, GROUP A STREP [facility])  POCT RAPID STREP A (OFFICE)    EKG   Radiology No results found.  Procedures Procedures (including critical care time)  Medications Ordered in UC Medications - No data to display  Initial Impression / Assessment and Plan / UC Course  I have reviewed the triage vital signs and the nursing notes.  Pertinent labs & imaging results that were available during my care  of the patient were reviewed by me and considered in my medical  decision making (see chart for details).     Reviewed exam and sx with patient. No red flags. Neg rapid strep, will culture. COVID PCR and will contact for any positive results.  Discussed viral illness and symptomatic treatment.  Promethazine DM as needed for cough.  Side effect profile reviewed.  Atrovent nasal spray for congestion.  PCP follow-up if symptoms do not improve.  ER precautions reviewed and patient verbalized understanding. Final Clinical Impressions(s) / UC Diagnoses   Final diagnoses:  Sore throat  Viral upper respiratory illness     Discharge Instructions      The clinic will contact you with results of the COVID test done today if positive.  You may take Promethazine DM as needed for cough.  Please of this medication will make you drowsy.  Do not drink alcohol or drive on this medication.  Atrovent nasal spray as needed for congestion.  Lots of rest and fluids.  Follow-up with your PCP if your symptoms do not improve.  Please go to the ER for any worsening symptoms.  I hope you feel better soon!     ED Prescriptions     Medication Sig Dispense Auth. Provider   ipratropium (ATROVENT) 0.03 % nasal spray Place 2 sprays into both nostrils every 12 (twelve) hours. 30 mL Radford Pax, NP   promethazine-dextromethorphan (PROMETHAZINE-DM) 6.25-15 MG/5ML syrup Take 5 mLs by mouth 4 (four) times daily as needed for cough. 118 mL Radford Pax, NP      PDMP not reviewed this encounter.   Radford Pax, NP 01/21/23 1141

## 2023-01-21 NOTE — ED Triage Notes (Addendum)
Pt presents with c/o nasal congestion and sore throat x 4 days. C/o abd pain when coughing  Pt  c/o having a dry cough and intermittent productive cough. States her son had a URI last week.   Home interventions: Theraflu and has used vicks at home.

## 2023-01-21 NOTE — Discharge Instructions (Signed)
The clinic will contact you with results of the COVID test done today if positive.  You may take Promethazine DM as needed for cough.  Please of this medication will make you drowsy.  Do not drink alcohol or drive on this medication.  Atrovent nasal spray as needed for congestion.  Lots of rest and fluids.  Follow-up with your PCP if your symptoms do not improve.  Please go to the ER for any worsening symptoms.  I hope you feel better soon!

## 2023-01-22 LAB — SARS CORONAVIRUS 2 (TAT 6-24 HRS): SARS Coronavirus 2: NEGATIVE

## 2023-01-24 LAB — CULTURE, GROUP A STREP (THRC)

## 2023-04-22 IMAGING — US US MFM OB FOLLOW-UP
1 series · 13 of 28 positions shown · non-contrast
Comparison: none

[Series 1: us mfm ob follow-up · 42 acquisitions, 13 frames shown]
[im 2/42]
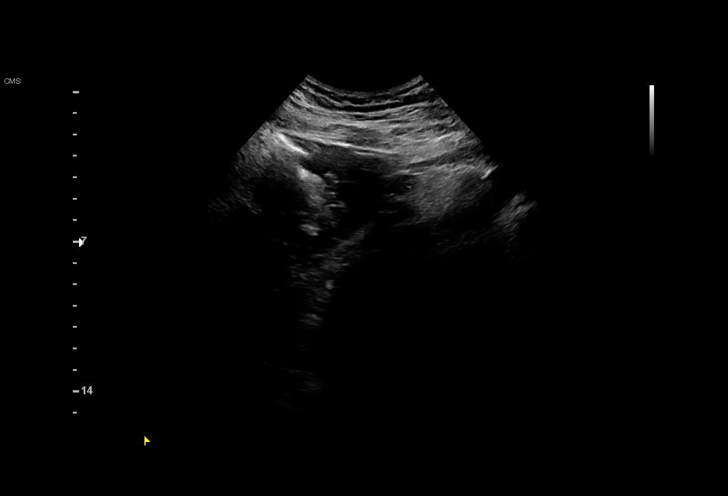
[im 5/42]
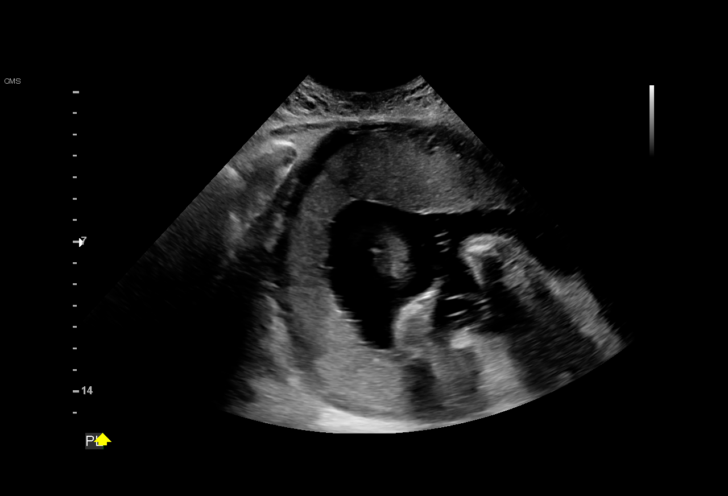
[im 8/42]
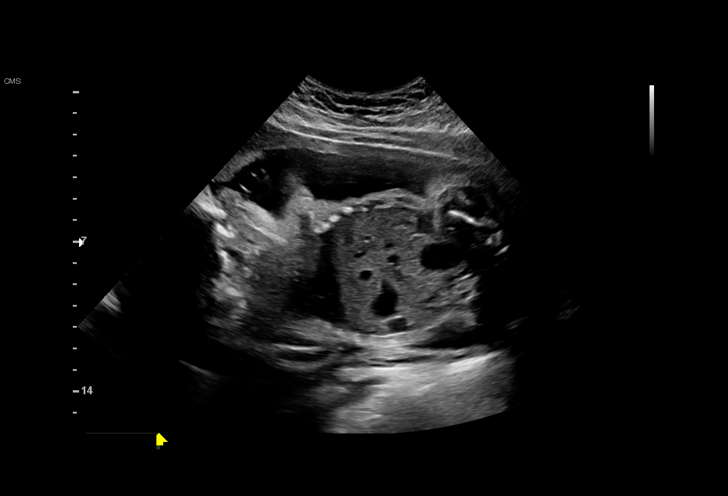
[im 11/42]
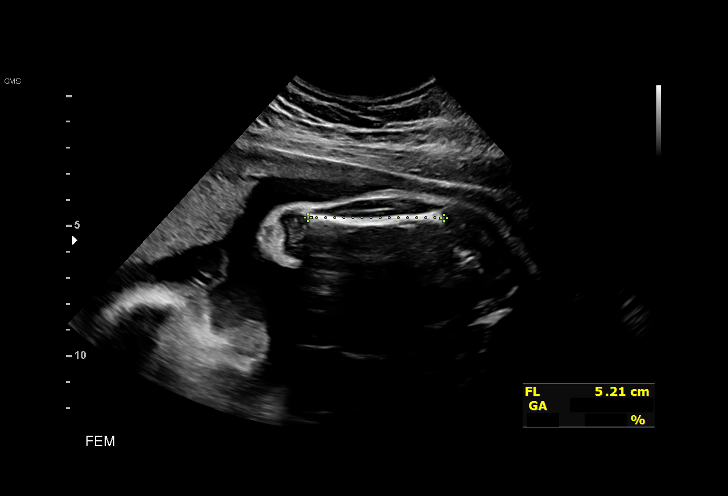
[im 14/42]
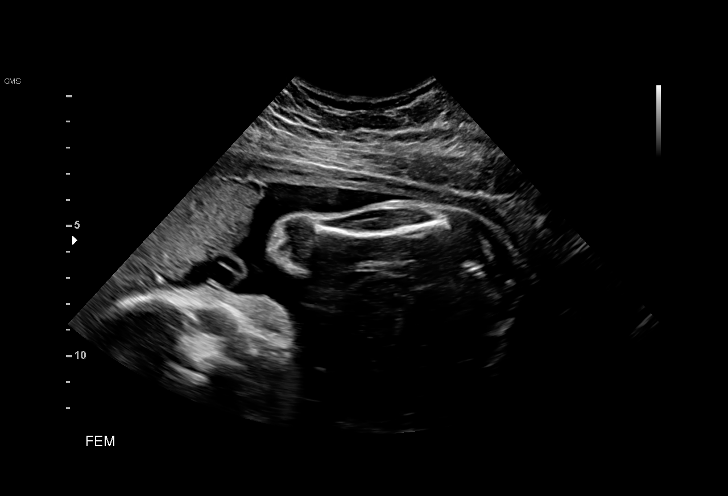
[im 17/42]
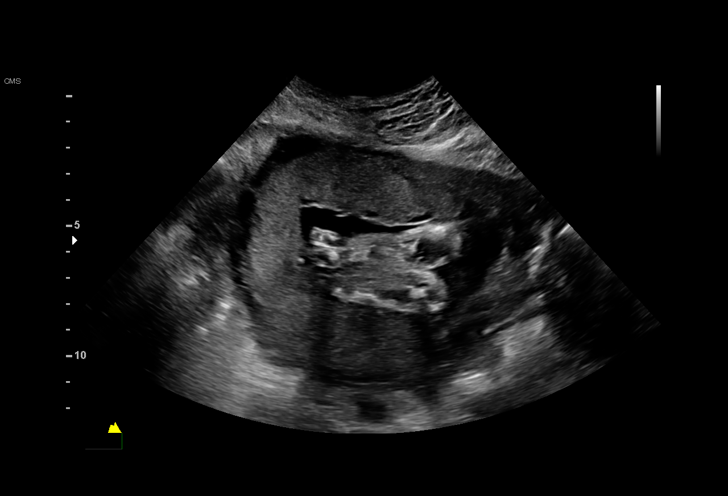
[im 22/42]
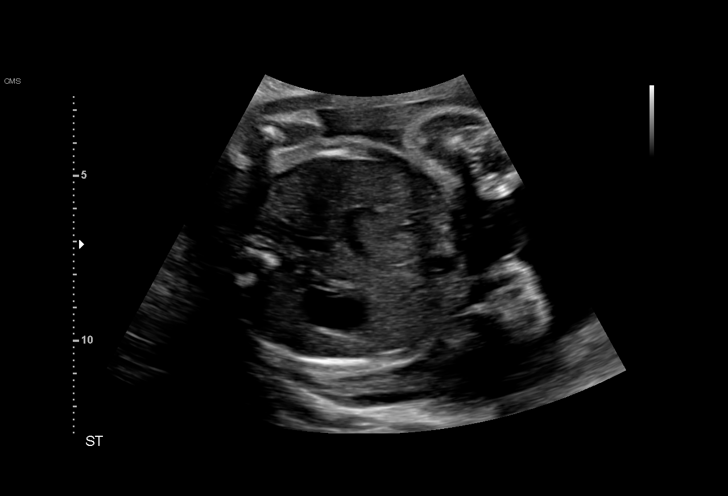
[im 25/42]
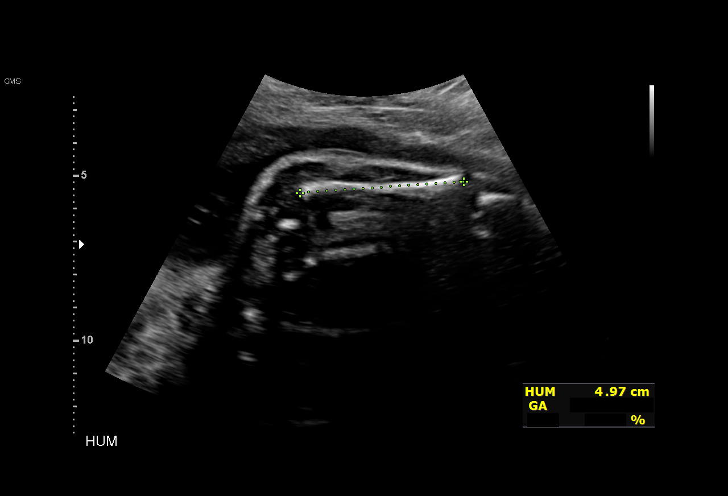
[im 28/42]
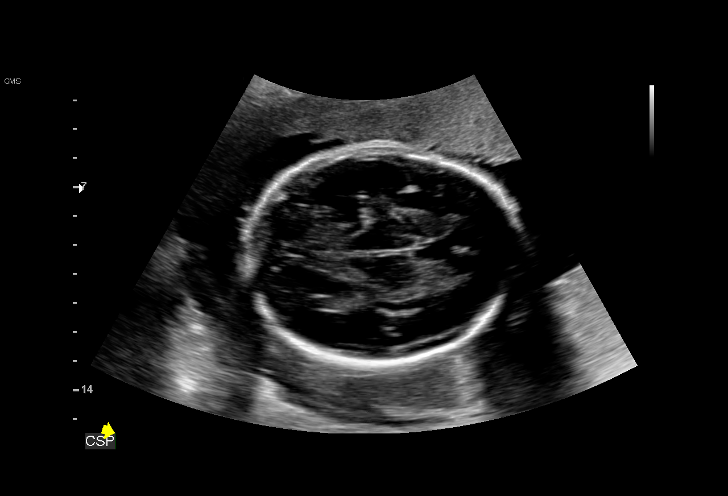
[im 31/42]
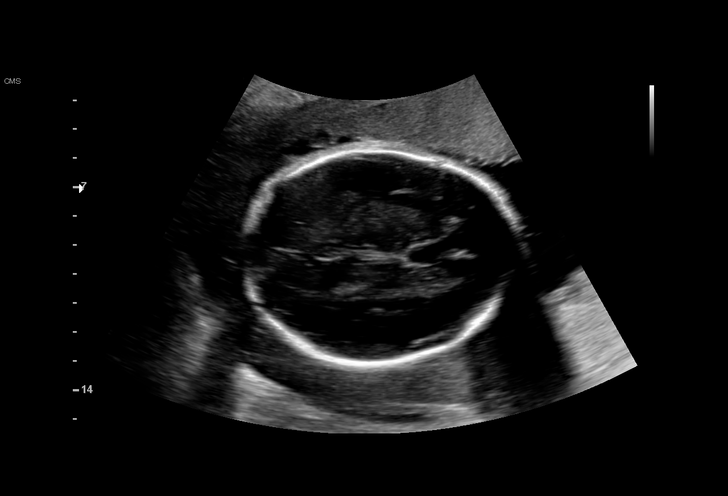
[im 34/42]
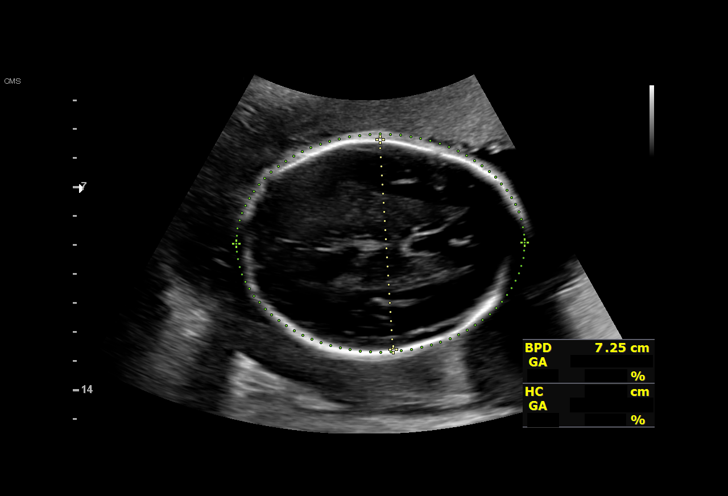
[im 37/42]
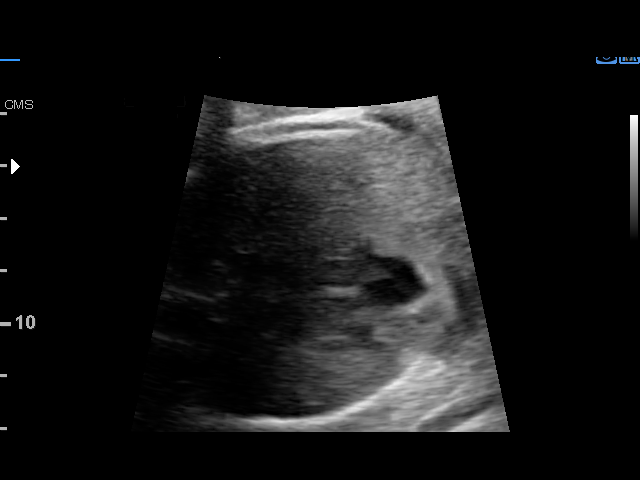
[im 40/42]
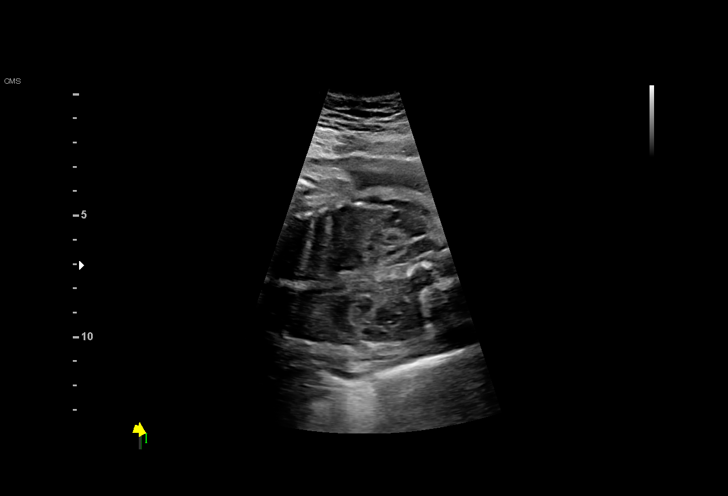

[13 of 28 positions shown; findings below may reference images not displayed]

[facility]
 Referred By:      [facility] MA[facility]/Triage

Indications

 28 weeks gestation of pregnancy
 Advanced maternal age multigravida 35+,
 second trimester (34 yrs)
 Hypothyroid
 Low Risk NIPS
 Genetic carrier (Silent Strong Schock)
 Velamentous insertion of umbilical cord
Fetal Evaluation

 Num Of Fetuses:         1
 Preg. Location:         Intrauterine
 Fetal Heart Rate(bpm):  132
 Cardiac Activity:       Observed
 Presentation:           Breech
 Placenta:               Fundal
 P. Cord Insertion:      Velamentous insertion (prev)

 Amniotic Fluid
 AFI FV:      Within normal limits

 AFI Sum(cm)     %Tile       Largest Pocket(cm)
 17.7            67          6.

 RUQ(cm)       RLQ(cm)       LUQ(cm)        LLQ(cm)
 3.11          3.98          6
Biometry
 BPD:      72.6  mm     G. Age:  29w 1d         75  %    CI:        69.96   %    70 - 86
                                                         FL/HC:      18.8   %    18.8 -
 HC:      276.9  mm     G. Age:  30w 2d         86  %    HC/AC:      1.15        1.05 -
 AC:      240.6  mm     G. Age:  28w 2d         53  %    FL/BPD:     71.6   %    71 - 87
 FL:         52  mm     G. Age:  27w 5d         27  %    FL/AC:      21.6   %    20 - 24
 HUM:      49.7  mm     G. Age:  29w 1d         69  %
 LV:        4.3  mm

 Est. FW:    6767  gm    2 lb 11 oz      52  %
OB History

 Gravidity:    1
Gestational Age

 Clinical EDD:  28w 0d                                        EDD:   01/03/21
 U/S Today:     28w 6d                                        EDD:   12/28/20
 Best:          28w 0d     Det. By:  Clinical EDD             EDD:   01/03/21
Anatomy

 Cranium:               Appears normal         Aortic Arch:            Previously seen
 Cavum:                 Appears normal         Ductal Arch:            Previously seen
 Ventricles:            Appears normal         Diaphragm:              Appears normal
 Choroid Plexus:        Previously seen        Stomach:                Appears normal, left
                                                                       sided
 Cerebellum:            Previously seen        Abdomen:                Previously seen
 Posterior Fossa:       Previously seen        Abdominal Wall:         Previously seen
 Nuchal Fold:           Previously seen        Cord Vessels:           Previously seen
 Face:                  previously seen        Kidneys:                Appear normal
 Lips:                  Previously seen        Bladder:                Appears normal
 Thoracic:              Previously seen        Spine:                  Previously seen
 Heart:                 Previously seen        Upper Extremities:      Previously seen
 RVOT:                  Previously seen        Lower Extremities:      Previously seen
 LVOT:                  Previously seen

 Other:  Fetus appears to be a male. Heels previously visualized. Technically
         difficult due to maternal habitus and fetal position.
Cervix Uterus Adnexa

 Cervix
 Not visualized (advanced GA >25wks)

 Uterus
 No abnormality visualized.

 Right Ovary
 Not visualized.

 Left Ovary
 Not visualized.

 Cul De Sac
 No free fluid seen.
 Adnexa
 No abnormality visualized.
Impression

 Velamentous cord insertion.  Patient returned for fetal growth
 assessment.
 Fetal growth is appropriate for gestational age.  Amniotic fluid
 is normal and good fetal activity seen.
 Patient has a diagnosis of hypothyroidism but her most
 recent TSH levels are within normal range.
Recommendations

 -An appointment was made for her to return in 4 weeks for
 fetal growth assessment and to reassess placental cord
 insertion.
                 Jim, Shalley

## 2023-05-20 IMAGING — US US MFM OB FOLLOW-UP
1 series · 14 of 28 positions shown · non-contrast
Comparison: none

[Series 1: us mfm ob follow-up · 47 acquisitions, 14 frames shown]
[im 2/47]
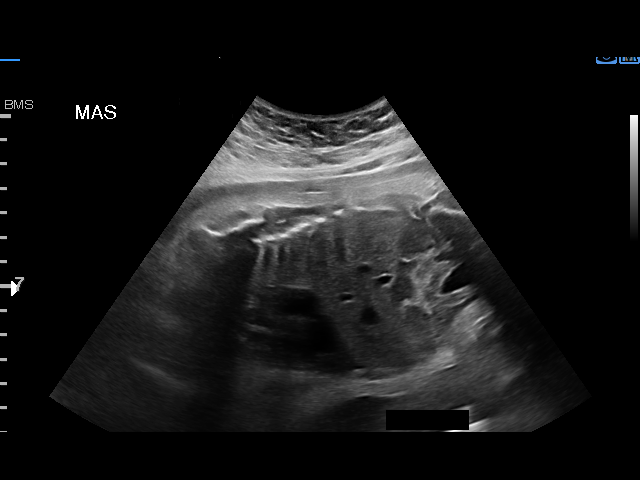
[im 6/47]
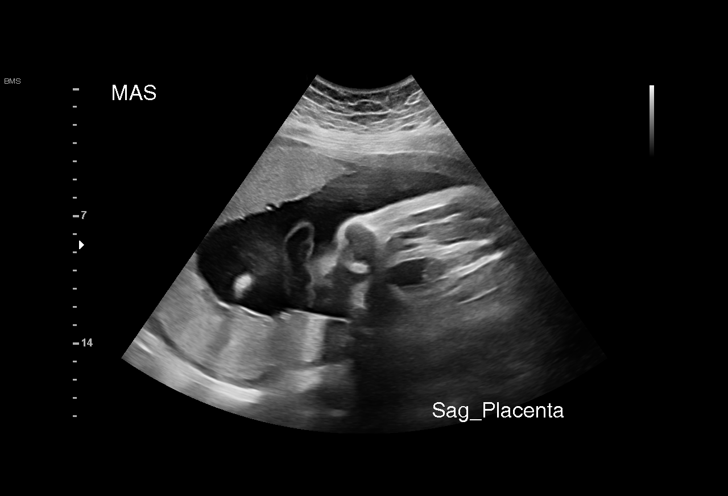
[im 9/47]
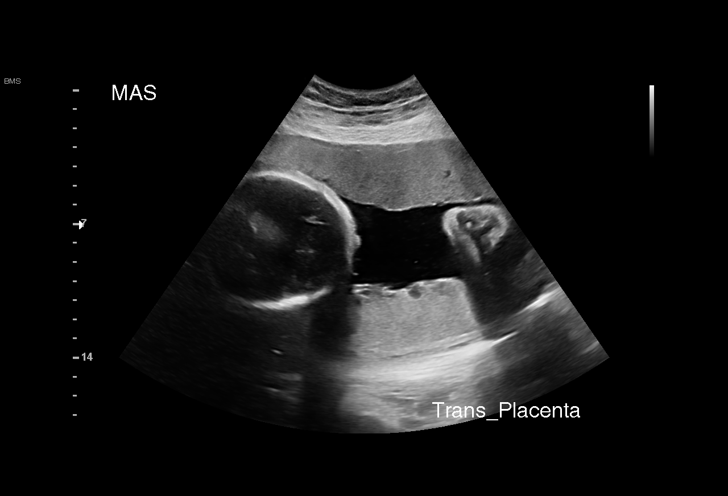
[im 12/47]
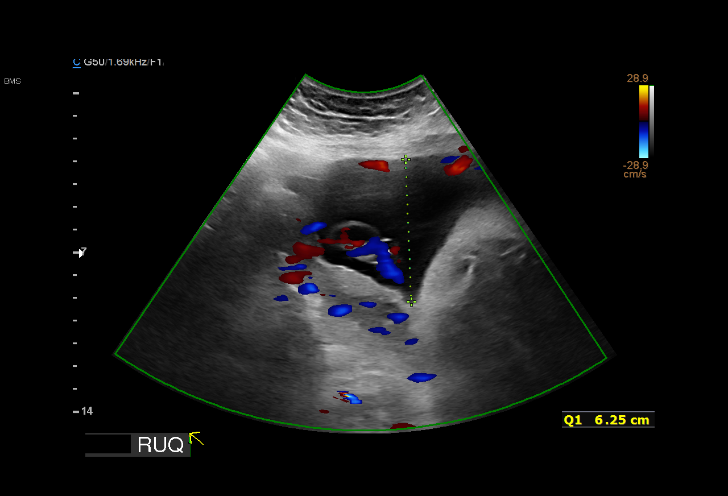
[im 16/47]
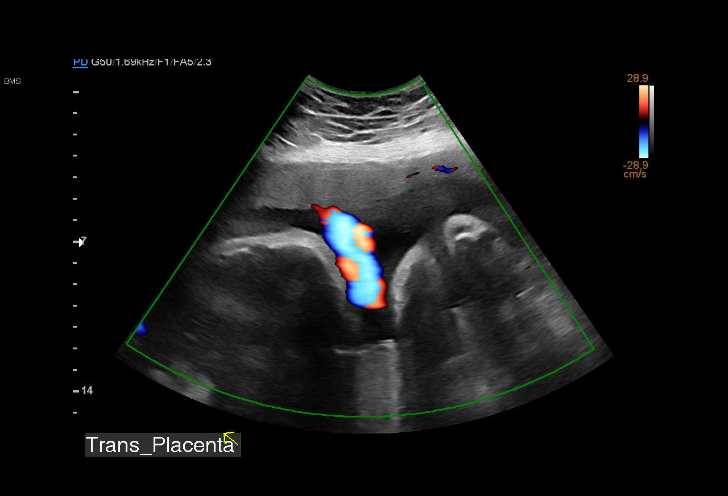
[im 19/47]
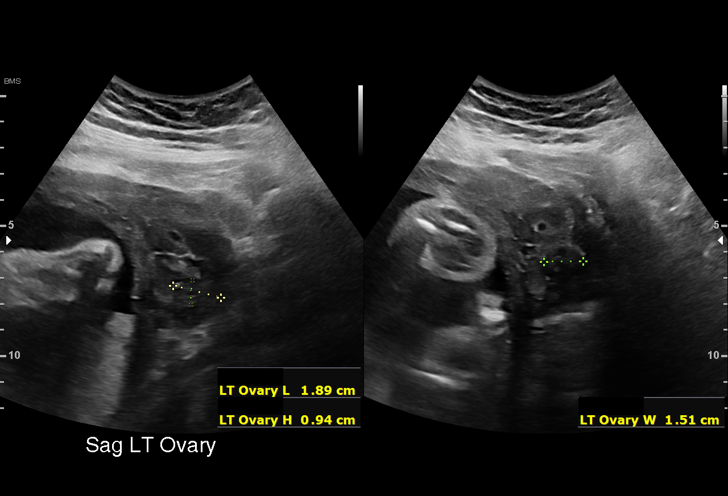
[im 23/47]
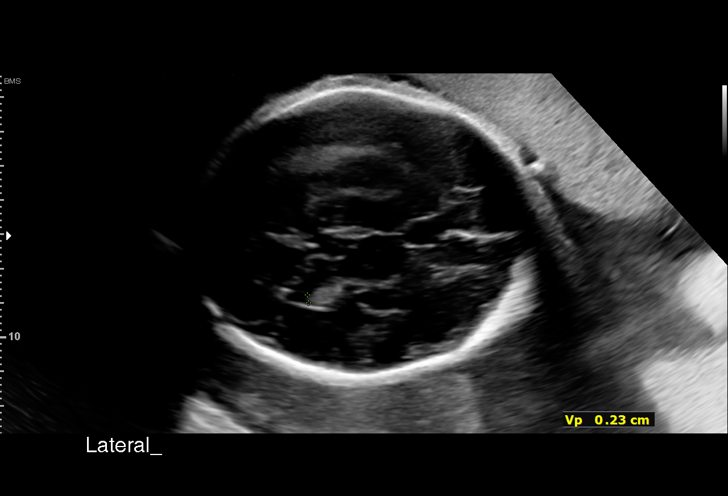
[im 26/47]
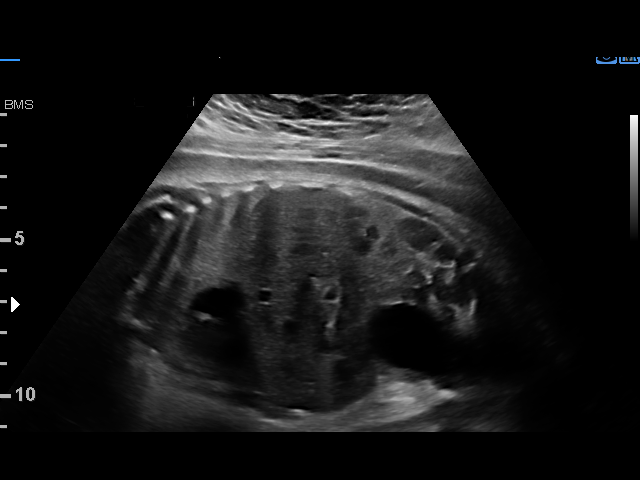
[im 29/47]
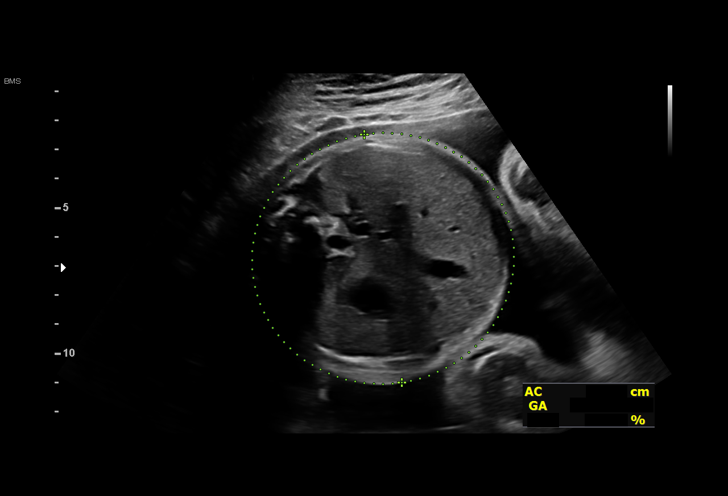
[im 33/47]
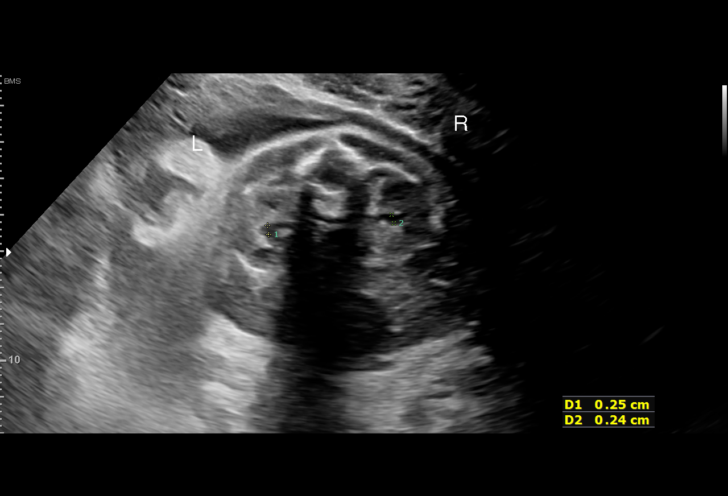
[im 36/47]
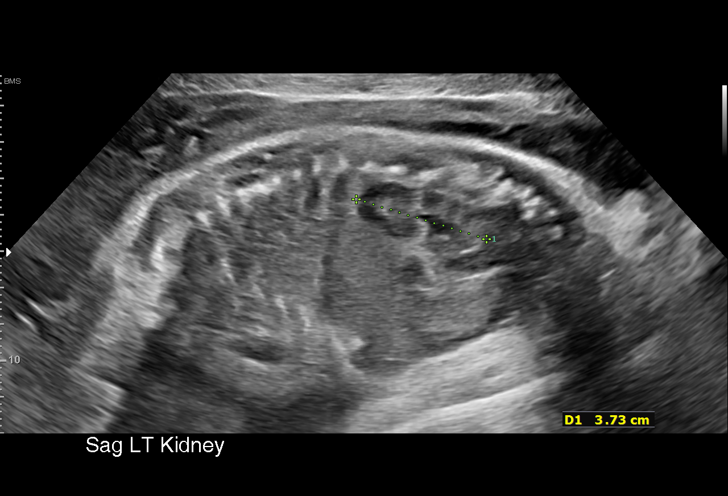
[im 40/47]
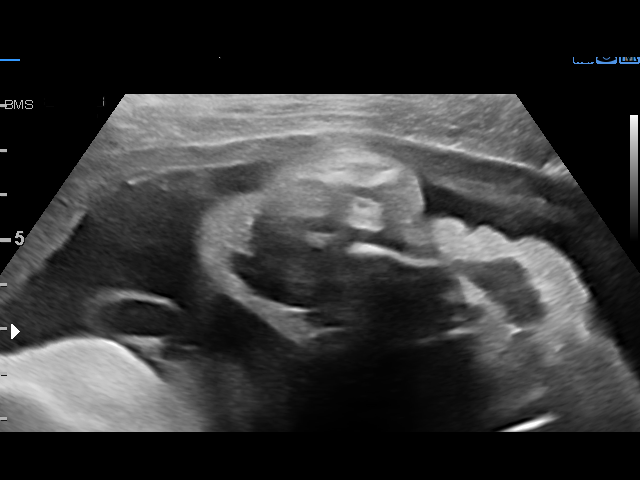
[im 43/47]
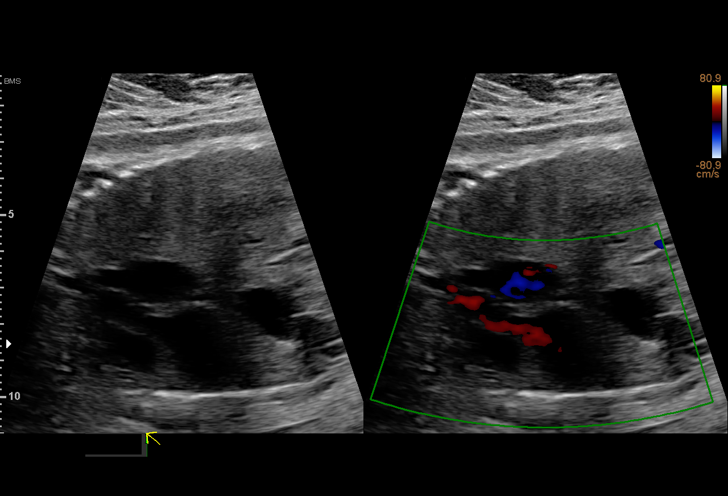
[im 47/47]
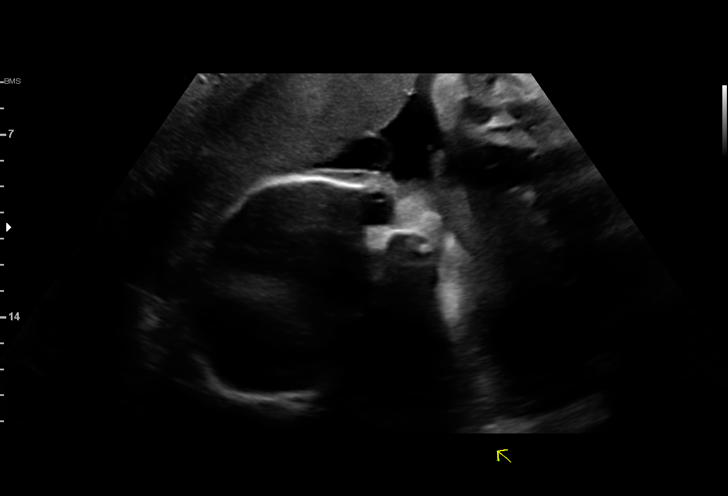

[14 of 28 positions shown; findings below may reference images not displayed]

[facility]
 Referred By:      [facility] MA[facility]/Triage

Indications

 Vaginal bleeding in pregnancy, third trimester
 Advanced maternal age multigravida 35+,
 second trimester (34 yrs)
 Hypothyroid
 Low Risk NIPS
 Genetic carrier (Silent Yasotha Rodzi)
 Velamentous insertion of umbilical cord
 32 weeks gestation of pregnancy
Fetal Evaluation

 Num Of Fetuses:         1
 Fetal Heart Rate(bpm):  137
 Cardiac Activity:       Observed
 Presentation:           Breech
 Placenta:               Fundal
 P. Cord Insertion:      Velamentous insertion prev

 Amniotic Fluid
 AFI FV:      Within normal limits

 AFI Sum(cm)     %Tile       Largest Pocket(cm)
 14.9            52

 RUQ(cm)       RLQ(cm)       LUQ(cm)        LLQ(cm)

Biometry
 BPD:      82.8  mm     G. Age:  33w 2d         79  %    CI:        77.47   %    70 - 86
                                                         FL/HC:      20.5   %    19.1 -
 HC:      297.8  mm     G. Age:  33w 0d         38  %    HC/AC:      1.07        0.96 -
 AC:      278.5  mm     G. Age:  31w 6d         46  %    FL/BPD:     73.8   %    71 - 87
 FL:       61.1  mm     G. Age:  31w 5d         29  %    FL/AC:      21.9   %    20 - 24
 LV:        2.3  mm

 Est. FW:    7582  gm      4 lb 3 oz     41  %
OB History

 Gravidity:    1
Gestational Age

 Clinical EDD:  32w 0d                                        EDD:   01/03/21
 U/S Today:     32w 3d                                        EDD:   12/31/20
 Best:          32w 0d     Det. By:  Clinical EDD             EDD:   01/03/21
Anatomy

 Cranium:               Appears normal         Aortic Arch:            Previously seen
 Cavum:                 Appears normal         Ductal Arch:            Previously seen
 Ventricles:            Appears normal         Diaphragm:              Appears normal
 Choroid Plexus:        Previously seen        Stomach:                Appears normal, left
                                                                       sided
 Cerebellum:            Previously seen        Abdomen:                Previously seen
 Posterior Fossa:       Previously seen        Abdominal Wall:         Previously seen
 Nuchal Fold:           Previously seen        Cord Vessels:           Previously seen
 Face:                  previously seen        Kidneys:                Appear normal
 Lips:                  Previously seen        Bladder:                Appears normal
 Thoracic:              Previously seen        Spine:                  Previously seen
 Heart:                 Appears normal         Upper Extremities:      Previously seen
                        (4CH, axis, and
                        situs)
 RVOT:                  Previously seen        Lower Extremities:      Previously seen
 LVOT:                  Appears normal

 Other:  Fetus appears to be a male. Heels previously visualized. Technically
         difficult due to maternal habitus and fetal position.
Cervix Uterus Adnexa

 Cervix
 Not visualized (advanced GA >62wks)

 Uterus
 No abnormality visualized.

 Right Ovary
 Not visualized.

 Left Ovary
 Within normal limits.
 Cul De Sac
 No free fluid seen.

 Adnexa
 No adnexal mass visualized.
Impression

 Follow up growth due to velamentous
 Normal interval growth with measurements consistent with
 dates
 Good fetal movement and amniotic fluid volume
 Breech presentation today.
Recommendations

 Follow up growth in 4 weeks
 Assess fetal position at that time.

## 2023-12-30 IMAGING — MG DIGITAL DIAGNOSTIC BILAT W/ TOMO W/ CAD
8 series · 8 of 24 positions shown · non-contrast
Comparison: None.

CLINICAL DATA: 35-year-old female presenting for evaluation of pain
in the upper-outer left breast into the axilla and the superior
aspect of the left breast. She had this for about 3 days and was
last felt a week and half ago. She is currently pumping but says her
breast milk production is decreasing.

EXAM:
DIGITAL DIAGNOSTIC BILATERAL MAMMOGRAM WITH TOMOSYNTHESIS AND CAD
TECHNIQUE: Bilateral digital diagnostic mammography and breast tomosynthesis
was performed. The images were evaluated with computer-aided
detection.

[L CC synth-2D]
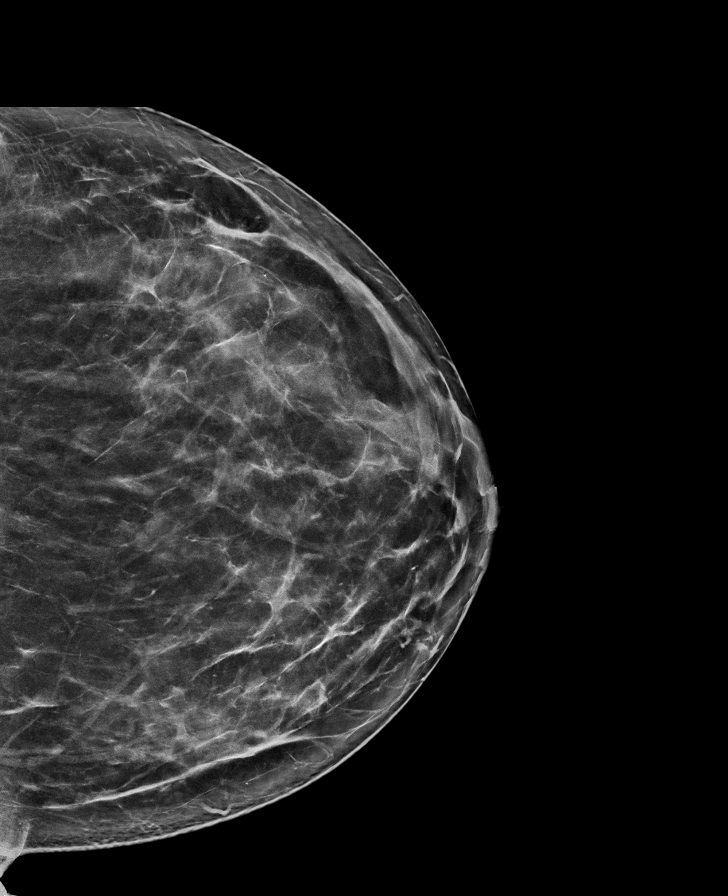

[L MLO synth-2D]
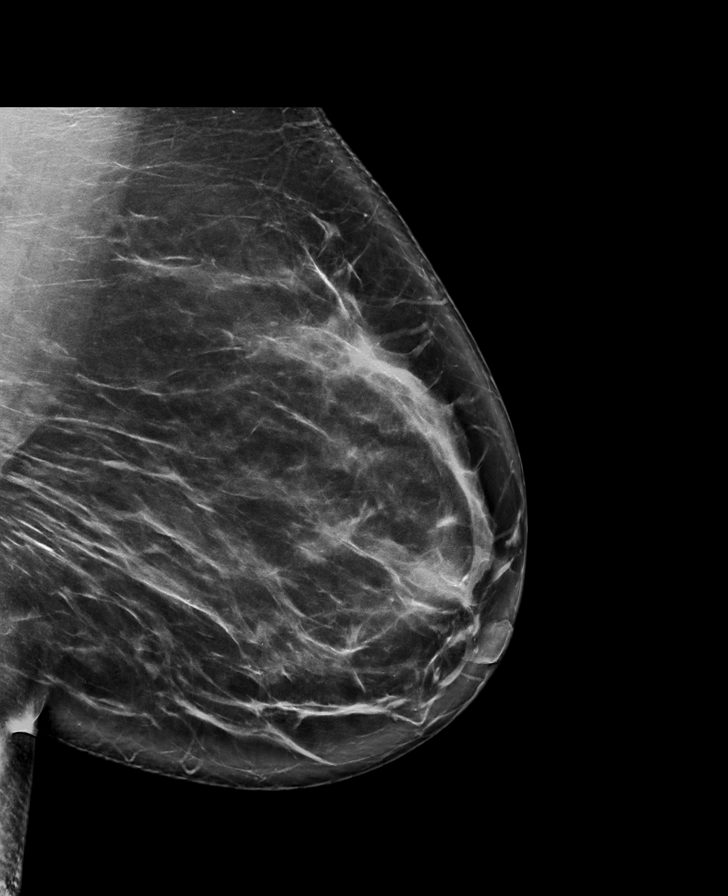

[R MLO synth-2D]
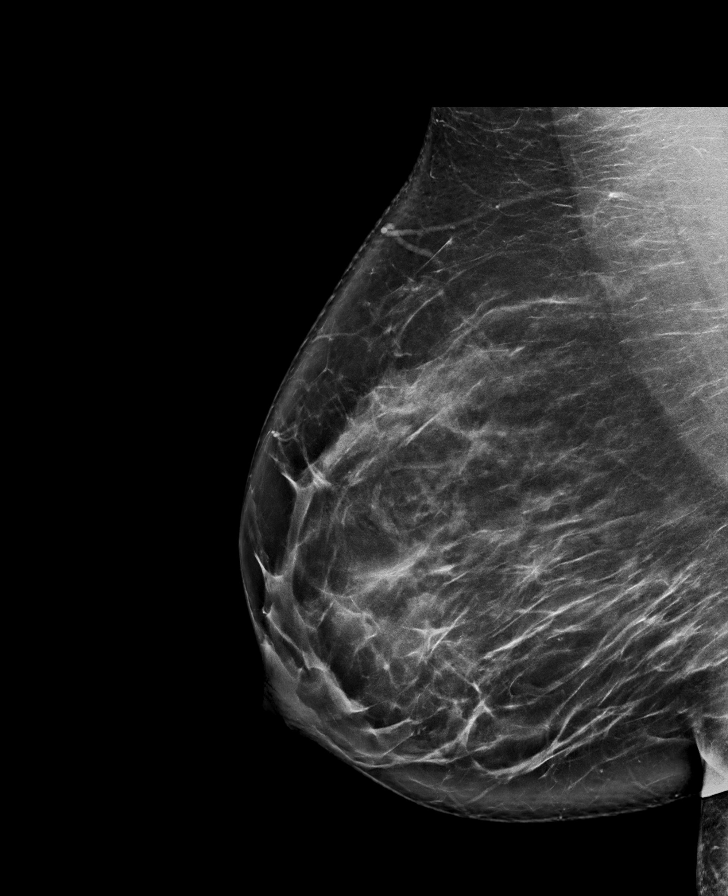

[R CC synth-2D]
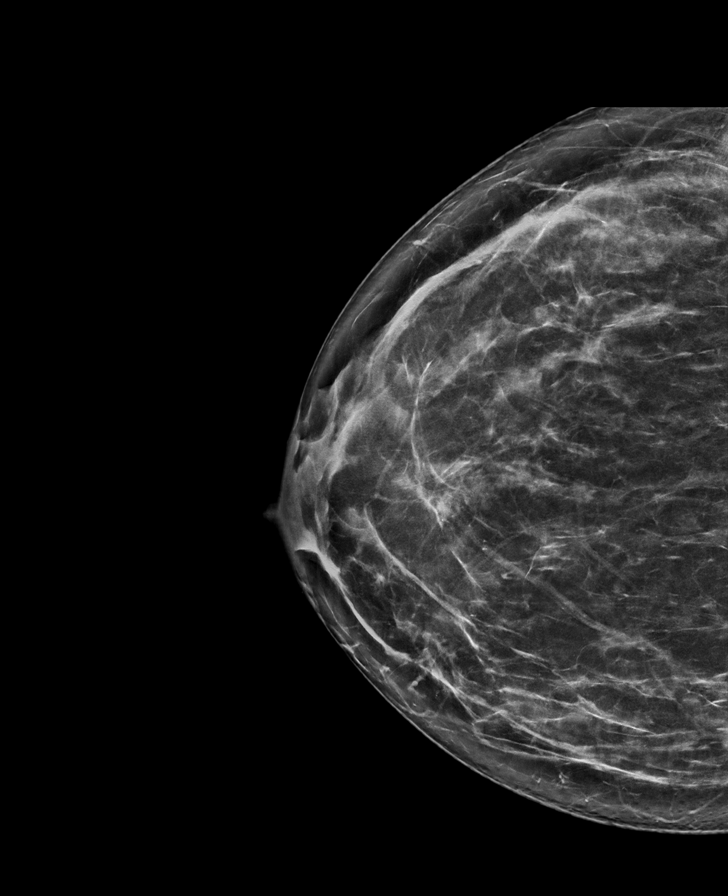

[R MLO tomo · tomo slice 51/102.0]
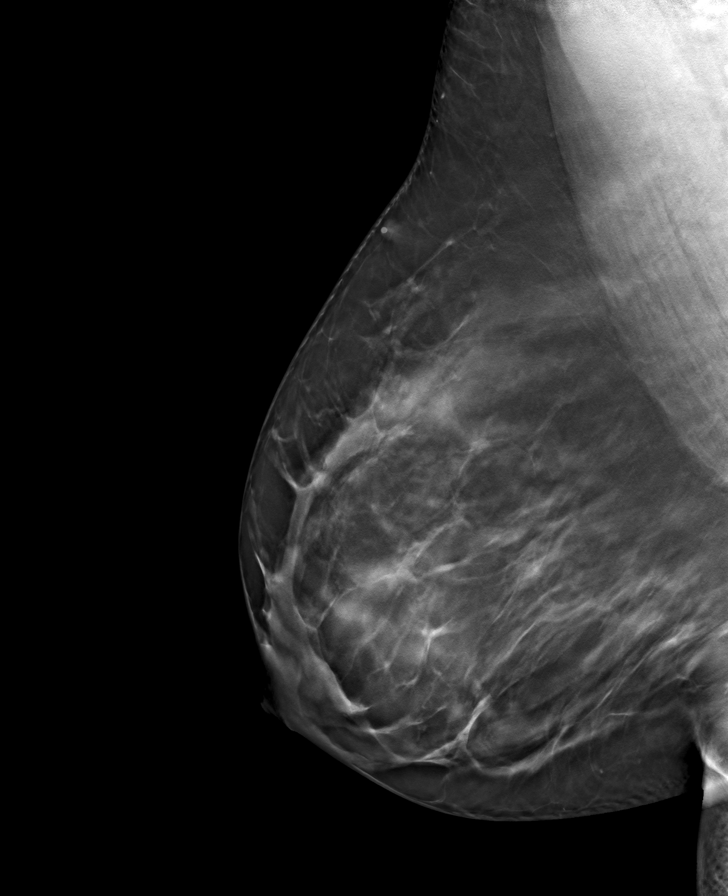

[L MLO tomo · tomo slice 52/103.0]
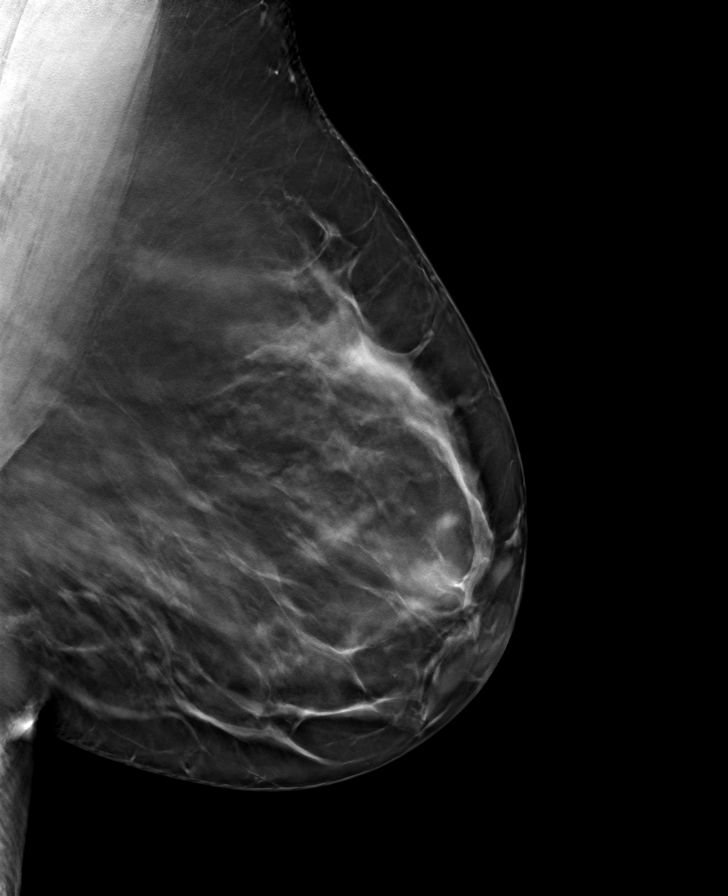

[L CC tomo · tomo slice 45/88.0]
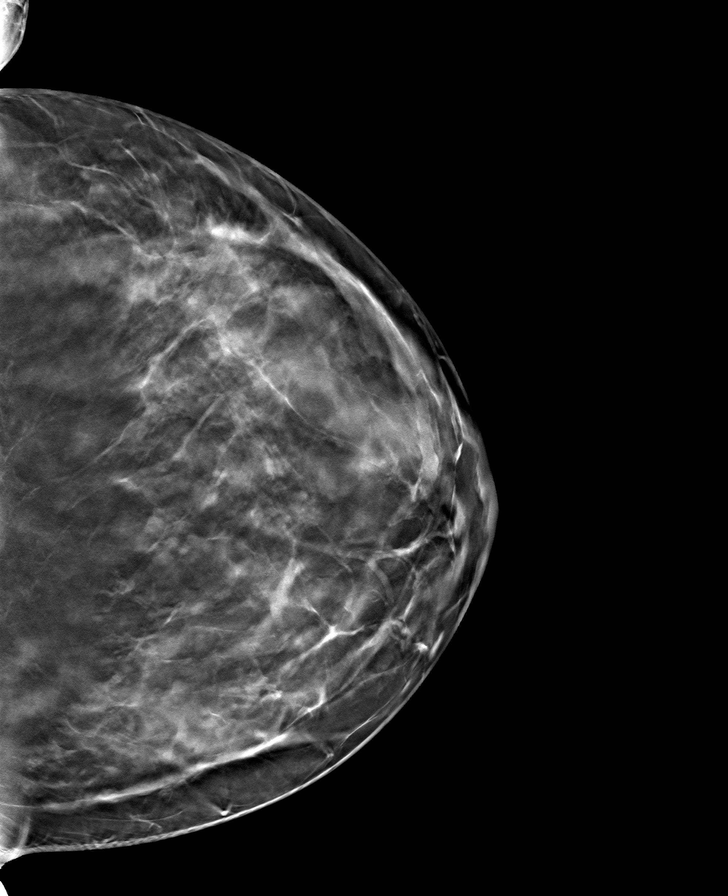

[R CC tomo · tomo slice 41/81.0]
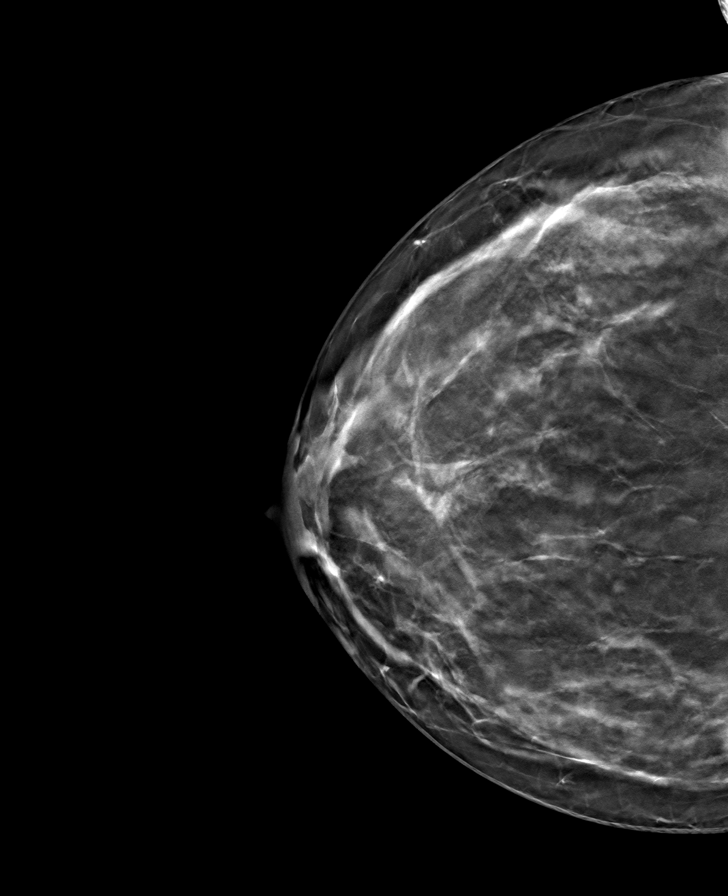

[8 of 24 positions shown; findings below may reference images not displayed]

ACR Breast Density Category c: The breast tissue is heterogeneously
dense, which may obscure small masses.
FINDINGS: No suspicious calcifications, masses or areas of distortion are seen
in the bilateral breasts.
IMPRESSION: 1. No suspicious mammographic findings to explain the patient's
prior nonfocal breast pain.

2.  No mammographic evidence of malignancy in the bilateral breasts.

RECOMMENDATION:
Screening mammogram at age 40 unless there are persistent or
intervening clinical concerns. (Code:[id number])

I have discussed the findings and recommendations with the patient.
If applicable, a reminder letter will be sent to the patient
regarding the next appointment.

BI-RADS CATEGORY  1: Negative.

## 2024-01-08 ENCOUNTER — Other Ambulatory Visit: Payer: Self-pay

## 2024-01-08 ENCOUNTER — Emergency Department (HOSPITAL_COMMUNITY)
Admission: EM | Admit: 2024-01-08 | Discharge: 2024-01-08 | Disposition: A | Discharge status: Home / Self Care | Payer: Self-pay | Attending: Emergency Medicine | Admitting: Emergency Medicine

## 2024-01-08 DIAGNOSIS — N939 Abnormal uterine and vaginal bleeding, unspecified: Secondary | ICD-10-CM | POA: Insufficient documentation

## 2024-01-08 LAB — CBC WITH DIFFERENTIAL/PLATELET
Abs Immature Granulocytes: 0.01 K/uL (ref 0.00–0.07)
Basophils Absolute: 0 K/uL (ref 0.0–0.1)
Basophils Relative: 0 %
Eosinophils Absolute: 0.1 K/uL (ref 0.0–0.5)
Eosinophils Relative: 1 %
HCT: 41.9 % (ref 36.0–46.0)
Hemoglobin: 13.7 g/dL (ref 12.0–15.0)
Immature Granulocytes: 0 %
Lymphocytes Relative: 31 %
Lymphs Abs: 1.8 K/uL (ref 0.7–4.0)
MCH: 28.1 pg (ref 26.0–34.0)
MCHC: 32.7 g/dL (ref 30.0–36.0)
MCV: 85.9 fL (ref 80.0–100.0)
Monocytes Absolute: 0.3 K/uL (ref 0.1–1.0)
Monocytes Relative: 5 %
Neutro Abs: 3.6 K/uL (ref 1.7–7.7)
Neutrophils Relative %: 63 %
Platelets: 227 K/uL (ref 150–400)
RBC: 4.88 MIL/uL (ref 3.87–5.11)
RDW: 13.7 % (ref 11.5–15.5)
WBC: 5.8 K/uL (ref 4.0–10.5)
nRBC: 0 % (ref 0.0–0.2)

## 2024-01-08 LAB — I-STAT CHEM 8, ED
BUN: 9 mg/dL (ref 6–20)
Calcium, Ion: 1.15 mmol/L (ref 1.15–1.40)
Chloride: 106 mmol/L (ref 98–111)
Creatinine, Ser: 0.9 mg/dL (ref 0.44–1.00)
Glucose, Bld: 91 mg/dL (ref 70–99)
HCT: 43 % (ref 36.0–46.0)
Hemoglobin: 14.6 g/dL (ref 12.0–15.0)
Potassium: 3.9 mmol/L (ref 3.5–5.1)
Sodium: 141 mmol/L (ref 135–145)
TCO2: 21 mmol/L — ABNORMAL LOW (ref 22–32)

## 2024-01-08 LAB — HCG, SERUM, QUALITATIVE: Preg, Serum: NEGATIVE

## 2024-01-08 NOTE — ED Triage Notes (Signed)
 Patient with vaginal bleeding x 3 days. Not due for period until next week. Nickel sized blood clots yesterday.

## 2024-01-08 NOTE — ED Provider Notes (Signed)
  [place] EMERGENCY DEPARTMENT AT [facility] Provider Note   CSN: [id number] Arrival date & time: 01/08/24  1404     Patient presents with: Vaginal Bleeding   Brittney Salazar is a 38 y.o. female.   38 y.o. female  was evaluated in triage.  Pt complains of vaginal bleeding. LMP due next week, started bleeding this week. Passing nickel size clots and stringy blood. Similar cycle last week. Unsure if pregnant. Has not been to GYN x 1 year.        Prior to Admission medications   Medication Sig Start Date End Date Taking? Authorizing Provider  fluticasone  (FLONASE ) 50 MCG/ACT nasal spray Place 2 sprays into both nostrils daily. 10/10/22   Kabbe, Angela M, NP  ipratropium (ATROVENT ) 0.03 % nasal spray Place 2 sprays into both nostrils every 12 (twelve) hours. 01/21/23   Loreda Myla SAUNDERS, NP  Prenatal MV & Min w/FA-DHA (PRENATAL GUMMIES PO) Take 2 tablets by mouth daily.    [provider]  promethazine -dextromethorphan (PROMETHAZINE -DM) 6.25-15 MG/5ML syrup Take 5 mLs by mouth 4 (four) times daily as needed for cough. 01/21/23   Mayer, Jodi R, NP    Allergies: Ciprofloxacin    Review of Systems Negative except as per HPI Updated Vital Signs BP 123/76   Pulse 77   Temp 98.3 F (36.8 C) (Oral)   Resp 18   LMP 12/18/2023 (Exact Date)   SpO2 100%   Physical Exam Vitals and nursing note reviewed.  Constitutional:      General: She is not in acute distress.    Appearance: She is well-developed. She is not diaphoretic.  HENT:     Head: Normocephalic and atraumatic.  Pulmonary:     Effort: Pulmonary effort is normal.  Abdominal:     Palpations: Abdomen is soft.     Tenderness: There is no abdominal tenderness.  Neurological:     Mental Status: She is alert and oriented to person, place, and time.  Psychiatric:        Behavior: Behavior normal.     (all labs ordered are listed, but only abnormal results are displayed) Labs Reviewed  I-STAT CHEM 8, ED -  Abnormal; Notable for the following components:      Result Value   TCO2 21 (*)    All other components within normal limits  CBC WITH DIFFERENTIAL/PLATELET  HCG, SERUM, QUALITATIVE    EKG: None  Radiology: No results found.   Procedures   Medications Ordered in the ED - No data to display                                  Medical Decision Making Amount and/or Complexity of Data Reviewed Labs: ordered.   38 year old female presents emergency room with complaint of abnormal vaginal bleeding.  Abdomen is soft and nontender.  Vitals are reassuring.  CBC with normal hemoglobin and hematocrit.  hCG is negative.  Recommend follow-up with gynecology for further evaluation.  Patient declines referral list for local resources and requesting papers for discharge.     Final diagnoses:  Vaginal bleeding    ED Discharge Orders     None          Beverley Leita DELENA DEVONNA 01/08/24 1943    Armenta Canning, MD 01/12/24 [id number]

## 2024-01-08 NOTE — ED Provider Triage Note (Signed)
 Emergency Medicine Provider Triage Evaluation Note  Brittney Salazar , a 38 y.o. female  was evaluated in triage.  Pt complains of vaginal bleeding. LMP due next week, started bleeding this week. Passing nickel size clots and stringy blood. Similar cycle last week. Unsure if pregnant. Has not been to GYN x 1 year.   Review of Systems  Positive:  Negative:   Physical Exam  There were no vitals taken for this visit. Gen:   Awake, no distress   Resp:  Normal effort  MSK:   Moves extremities without difficulty  Other:  Abd soft.   Medical Decision Making  Medically screening exam initiated at 2:18 PM.  Appropriate orders placed.  Brittney Salazar was informed that the remainder of the evaluation will be completed by another provider, this initial triage assessment does not replace that evaluation, and the importance of remaining in the ED until their evaluation is complete.     Beverley Leita LABOR, PA-C 01/08/24 1426

## 2024-01-08 NOTE — Discharge Instructions (Signed)
 Follow up with your primary are provider or see GYN, call to schedule an appointment.

## 2024-01-08 NOTE — ED Notes (Signed)
 Unable to complete secondary assessment; pt very frustrated regarding wait time.

## 2024-01-09 ENCOUNTER — Other Ambulatory Visit: Payer: Self-pay | Admitting: Medical Genetics

## 2024-03-26 ENCOUNTER — Other Ambulatory Visit (HOSPITAL_COMMUNITY): Payer: Self-pay

## 2024-04-22 ENCOUNTER — Other Ambulatory Visit: Payer: Self-pay | Admitting: Medical Genetics

## 2024-04-22 DIAGNOSIS — Z006 Encounter for examination for normal comparison and control in clinical research program: Secondary | ICD-10-CM

## 2024-04-27 ENCOUNTER — Other Ambulatory Visit (HOSPITAL_COMMUNITY): Payer: Self-pay

## 2024-06-24 ENCOUNTER — Encounter: Payer: Self-pay | Admitting: Family Medicine
